# Patient Record
Sex: Female | Born: 1965 | Race: White | Hispanic: No | Marital: Single | Smoking: Current every day smoker
Health system: Southern US, Community
[De-identification: ages and names within clinical notes are randomized; demographics above are authoritative.]

## PROBLEM LIST (undated history)

## (undated) DIAGNOSIS — E559 Vitamin D deficiency, unspecified: Secondary | ICD-10-CM

## (undated) DIAGNOSIS — J45909 Unspecified asthma, uncomplicated: Secondary | ICD-10-CM

## (undated) DIAGNOSIS — R195 Other fecal abnormalities: Secondary | ICD-10-CM

## (undated) DIAGNOSIS — E7800 Pure hypercholesterolemia, unspecified: Secondary | ICD-10-CM

## (undated) HISTORY — PX: TUBAL LIGATION: SHX77

## (undated) HISTORY — PX: WISDOM TOOTH EXTRACTION: SHX21

## (undated) HISTORY — DX: Other fecal abnormalities: R19.5

## (undated) HISTORY — PX: TONSILLECTOMY AND ADENOIDECTOMY: SHX28

## (undated) HISTORY — DX: Vitamin D deficiency, unspecified: E55.9

## (undated) HISTORY — PX: APPENDECTOMY: SHX54

## (undated) HISTORY — DX: Pure hypercholesterolemia, unspecified: E78.00

## (undated) HISTORY — DX: Unspecified asthma, uncomplicated: J45.909

## (undated) HISTORY — PX: CHOLECYSTECTOMY: SHX55

---

## 2012-05-07 DIAGNOSIS — R195 Other fecal abnormalities: Secondary | ICD-10-CM

## 2012-05-07 HISTORY — DX: Other fecal abnormalities: R19.5

## 2012-09-12 ENCOUNTER — Other Ambulatory Visit (HOSPITAL_COMMUNITY)
Admission: RE | Admit: 2012-09-12 | Discharge: 2012-09-12 | Disposition: A | Discharge status: Home / Self Care | Payer: Self-pay | Source: Ambulatory Visit | Attending: Family Medicine | Admitting: Family Medicine

## 2012-09-12 DIAGNOSIS — R87810 Cervical high risk human papillomavirus (HPV) DNA test positive: Secondary | ICD-10-CM | POA: Insufficient documentation

## 2012-09-12 DIAGNOSIS — Z1151 Encounter for screening for human papillomavirus (HPV): Secondary | ICD-10-CM | POA: Insufficient documentation

## 2012-10-17 ENCOUNTER — Ambulatory Visit (INDEPENDENT_AMBULATORY_CARE_PROVIDER_SITE_OTHER): Payer: BC Managed Care – PPO | Admitting: Gynecology

## 2012-10-17 ENCOUNTER — Encounter: Payer: Self-pay | Admitting: Gynecology

## 2012-10-17 VITALS — BP 128/80 | Ht 61.0 in | Wt 146.0 lb

## 2012-10-17 DIAGNOSIS — N870 Mild cervical dysplasia: Secondary | ICD-10-CM | POA: Insufficient documentation

## 2012-10-17 NOTE — Progress Notes (Signed)
Patient is a 47 year old gravida 3 para 2 Ab1 who was referred for practice as a courtesy of Dr. 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  Patient denies any prior history of abnormal Pap smears. Patient currently not using any form of contraception. Patient reports normal menstrual cycles. Patient states she is in a monogamous relationship. Review of patient's record appears that there is a compliance issue on her part since she had been diagnosed by Dr. Zachery Dauer has having hyperlipidemia.  Patient underwent the detail colposcopic examination. Acetic acid was applied to the external genitalia and there was no lesions noted on the labia majora and minora periclitoral hood, perineum or perirectal region. A speculum was introduced into the vagina and acetic acid was once again applied and and a systematic fashion the entire vaginal tube, fornix, cervix was inspected. Endocervical speculum was also utilized and the transformation zone was visualized entirely without any lesions. An ECC was obtained and submitted for histological evaluation.   ASCCP Guidelines were discussed. If her ECC comes back benign it is recommended that she have a Pap smear with coat testing in 12 months. If HPV is negative she can return to Pap smears every 3 years. If the HPV comes back positive she will need colposcopy once again. All this was discussed with the patient and all questions were answered. We will send a copy of this office note to her primary physician Dr. Zachery Dauer.

## 2012-10-17 NOTE — Patient Instructions (Addendum)
Colposcopy Colposcopy is a procedure that uses a special lighted microscope (colposcope). It examines your cervix and vagina, or the area around the outside of the vagina, for signs of disease or abnormalities in the cells. You may be sent to a specialist (gynecologist) to do the colposcopy. A biopsy (tissue sample) may be collected during a colposcopy, if the caregiver finds any unusual cells. The biopsy is sent to the lab for further testing, and the results are reported back to your caregiver. A WOMAN MAY NEED THIS PROCEDURE IF:  She has had an abnormal pap smear (taking cells from the cervix for testing).  She has a sore on her cervix, and a Pap test was normal.  The Pap test suggests human papilloma virus (HPV). This virus can cause genital warts and is linked to the development of cervical cancer.  She has genital warts on the cervix, or in or around the outside of the vagina.  Her mother took the drug DES while pregnant.  She has painful intercourse.  She has vaginal bleeding, especially after sexual intercourse.  There is a need to evaluate the results of previous treatment. BEFORE THE PROCEDURE   Colposcopy is done when you are not having a menstrual period.  For 24 hours before the colposcopy, do not:  Douche.  Use tampons.  Use medicines, creams, or suppositories in the vagina.  Have sexual intercourse. PROCEDURE   A colposcopy is done while a woman is lying on her back with her feet in foot rests (stirrups).  A speculum is placed inside the vagina to keep it open and to allow the caregiver to see the cervix. This is the same instrument used to do a pap smear.  The colposcope is placed outside the vagina. It is used to magnify and examine the cervix, vagina, and the area around the outside of the vagina.  A small amount of liquid solution is placed on the area that is to be viewed. This solution is placed on with a cotton applicator. This solution makes it easier to  see the abnormal cells.  Your caregiver will suck out mucus and cells from the canal of the cervix.  Small pieces of tissue for biopsy may be taken at the same time. You may feel mild pain or discomfort when this is done.  Your caregiver will record the location of the abnormal areas and send the tissue samples to a lab for analysis.  If your caregiver biopsies the vagina or outside of the vagina, a local anesthetic (novocaine) is usually given. AFTER THE PROCEDURE   You may have some cramping that often goes away in a few minutes. You may have some soreness for a couple of days.  You may take over-the-counter pain medicine as advised by your caregiver. Do not take aspirin because it can cause bleeding.  Lie down for a few minutes if you feel lightheaded.  You may have some bleeding or dark discharge that should stop in a few days.  You may need to wear a sanitary pad for a few days. HOME CARE INSTRUCTIONS   Avoid sex, douching, and using tampons for a week or as directed.  Only take medicine as directed by your caregiver.  Continue to take birth control pills, if you are on them.  Not all test results are available during your visit. If your test results are not back during the visit, make an appointment with your caregiver to find out the results. Do not assume everything is   normal if you have not heard from your caregiver or the medical facility. It is important for you to follow up on all of your test results.  Follow your caregiver's advice regarding medicines, activity, follow-up visits, and follow-up Pap tests. SEEK MEDICAL CARE IF:   You develop a rash.  You have problems with your medicine. SEEK IMMEDIATE MEDICAL CARE IF:  You are bleeding heavily or are passing blood clots.  You develop a fever over 102 F (38.9 C), with or without chills.  You have abnormal vaginal discharge.  You are having cramps that do not go away after taking your pain medicine.  You  feel lightheaded, dizzy, or faint.  You develop stomach pain. Document Released: 07/14/2002 Document Revised: 07/16/2011 Document Reviewed: 02/24/2009 [employer] Patient Information 2014 [employer].  

## 2013-03-12 ENCOUNTER — Other Ambulatory Visit: Payer: Self-pay

## 2014-03-08 ENCOUNTER — Encounter: Payer: Self-pay | Admitting: Gynecology

## 2015-01-30 ENCOUNTER — Emergency Department (HOSPITAL_BASED_OUTPATIENT_CLINIC_OR_DEPARTMENT_OTHER): Payer: BLUE CROSS/BLUE SHIELD

## 2015-01-30 ENCOUNTER — Emergency Department (HOSPITAL_BASED_OUTPATIENT_CLINIC_OR_DEPARTMENT_OTHER)
Admission: EM | Admit: 2015-01-30 | Discharge: 2015-01-30 | Disposition: A | Discharge status: Home / Self Care | Payer: BLUE CROSS/BLUE SHIELD | Attending: Emergency Medicine | Admitting: Emergency Medicine

## 2015-01-30 ENCOUNTER — Encounter (HOSPITAL_BASED_OUTPATIENT_CLINIC_OR_DEPARTMENT_OTHER): Payer: Self-pay

## 2015-01-30 DIAGNOSIS — S299XXA Unspecified injury of thorax, initial encounter: Secondary | ICD-10-CM | POA: Insufficient documentation

## 2015-01-30 DIAGNOSIS — S0083XA Contusion of other part of head, initial encounter: Secondary | ICD-10-CM | POA: Insufficient documentation

## 2015-01-30 DIAGNOSIS — Z79899 Other long term (current) drug therapy: Secondary | ICD-10-CM | POA: Diagnosis not present

## 2015-01-30 DIAGNOSIS — S0012XA Contusion of left eyelid and periocular area, initial encounter: Secondary | ICD-10-CM | POA: Insufficient documentation

## 2015-01-30 DIAGNOSIS — Z7982 Long term (current) use of aspirin: Secondary | ICD-10-CM | POA: Insufficient documentation

## 2015-01-30 DIAGNOSIS — Y92410 Unspecified street and highway as the place of occurrence of the external cause: Secondary | ICD-10-CM | POA: Diagnosis not present

## 2015-01-30 DIAGNOSIS — Y9389 Activity, other specified: Secondary | ICD-10-CM | POA: Diagnosis not present

## 2015-01-30 DIAGNOSIS — Y998 Other external cause status: Secondary | ICD-10-CM | POA: Diagnosis not present

## 2015-01-30 DIAGNOSIS — Z8639 Personal history of other endocrine, nutritional and metabolic disease: Secondary | ICD-10-CM | POA: Insufficient documentation

## 2015-01-30 DIAGNOSIS — Z23 Encounter for immunization: Secondary | ICD-10-CM | POA: Insufficient documentation

## 2015-01-30 DIAGNOSIS — S0011XA Contusion of right eyelid and periocular area, initial encounter: Secondary | ICD-10-CM | POA: Insufficient documentation

## 2015-01-30 DIAGNOSIS — Z880 Allergy status to penicillin: Secondary | ICD-10-CM | POA: Diagnosis not present

## 2015-01-30 MED ORDER — ACETAMINOPHEN 500 MG PO TABS
1000.0000 mg | ORAL_TABLET | Freq: Once | ORAL | Status: AC
  Administered 2015-01-30: 1000 mg via ORAL
  Filled 2015-01-30: qty 2

## 2015-01-30 MED ORDER — TETANUS-DIPHTH-ACELL PERTUSSIS 5-2.5-18.5 LF-MCG/0.5 IM SUSP
0.5000 mL | Freq: Once | INTRAMUSCULAR | Status: AC
  Administered 2015-01-30: 0.5 mL via INTRAMUSCULAR
  Filled 2015-01-30: qty 0.5

## 2015-01-30 MED ORDER — OXYCODONE HCL 5 MG PO TABS
5.0000 mg | ORAL_TABLET | Freq: Once | ORAL | Status: AC
  Administered 2015-01-30: 5 mg via ORAL
  Filled 2015-01-30: qty 1

## 2015-01-30 MED ORDER — IBUPROFEN 800 MG PO TABS
800.0000 mg | ORAL_TABLET | Freq: Once | ORAL | Status: AC
  Administered 2015-01-30: 800 mg via ORAL
  Filled 2015-01-30: qty 1
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
## 2015-01-30 NOTE — ED Notes (Signed)
Patient transported to X-ray via stretcher, sr x 2 up 

## 2015-01-30 NOTE — ED Notes (Signed)
C/o pain at rt flank, rt lower back area, pain to rt hip
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
## 2015-01-30 NOTE — ED Notes (Signed)
MD at bedside. 
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
## 2015-02-03 ENCOUNTER — Encounter: Payer: Self-pay | Admitting: Family Medicine

## 2015-02-03 ENCOUNTER — Ambulatory Visit (INDEPENDENT_AMBULATORY_CARE_PROVIDER_SITE_OTHER): Payer: BLUE CROSS/BLUE SHIELD | Admitting: Family Medicine

## 2015-02-03 DIAGNOSIS — Z7189 Other specified counseling: Secondary | ICD-10-CM

## 2015-02-03 DIAGNOSIS — S161XXA Strain of muscle, fascia and tendon at neck level, initial encounter: Secondary | ICD-10-CM

## 2015-02-03 DIAGNOSIS — Z0000 Encounter for general adult medical examination without abnormal findings: Secondary | ICD-10-CM | POA: Diagnosis not present

## 2015-02-03 DIAGNOSIS — M545 Low back pain, unspecified: Secondary | ICD-10-CM

## 2015-02-03 DIAGNOSIS — Z7689 Persons encountering health services in other specified circumstances: Secondary | ICD-10-CM

## 2015-02-03 DIAGNOSIS — Z0279 Encounter for issue of other medical certificate: Secondary | ICD-10-CM

## 2015-02-03 DIAGNOSIS — R0789 Other chest pain: Secondary | ICD-10-CM | POA: Diagnosis not present

## 2015-02-03 MED ORDER — CYCLOBENZAPRINE HCL 5 MG PO TABS: 5.0000 mg | ORAL_TABLET | Freq: Two times a day (BID) | ORAL | Status: DC | PRN

## 2015-02-03 MED ORDER — IBUPROFEN 600 MG PO TABS: 600.0000 mg | ORAL_TABLET | Freq: Three times a day (TID) | ORAL | Status: DC | PRN

## 2015-02-03 NOTE — Progress Notes (Signed)
Pre visit review using our clinic review tool, if applicable. No additional management support is needed unless otherwise documented below in the visit note. 

## 2015-02-03 NOTE — Patient Instructions (Signed)
It was great to meet you today. Make an appointment within 2 months to get your complete physical and get established completely with [place], by then I will have your records.  Rest, heat, ibuprofen prescribed, flexeril (muscle relaxer) prescribed - take the relaxer at night only.   Cervical Sprain A cervical sprain is an injury in the neck in which the strong, fibrous tissues (ligaments) that connect your neck bones stretch or tear. Cervical sprains can range from mild to severe. Severe cervical sprains can cause the neck vertebrae to be unstable. This can lead to damage of the spinal cord and can result in serious nervous system problems. The amount of time it takes for a cervical sprain to get better depends on the cause and extent of the injury. Most cervical sprains heal in 1 to 3 weeks. CAUSES  Severe cervical sprains may be caused by:   Contact sport injuries (such as from football, rugby, wrestling, hockey, auto racing, gymnastics, diving, martial arts, or boxing).   Motor vehicle collisions.   Whiplash injuries. This is an injury from a sudden forward and backward whipping movement of the head and neck.  Falls.  Mild cervical sprains may be caused by:   Being in an awkward position, such as while cradling a telephone between your ear and shoulder.   Sitting in a chair that does not offer proper support.   Working at a poorly Marketing executive station.   Looking up or down for long periods of time.  SYMPTOMS   Pain, soreness, stiffness, or a burning sensation in the front, back, or sides of the neck. This discomfort may develop immediately after the injury or slowly, 24 hours or more after the injury.   Pain or tenderness directly in the middle of the back of the neck.   Shoulder or upper back pain.   Limited ability to move the neck.   Headache.   Dizziness.   Weakness, numbness, or tingling in the hands or arms.   Muscle spasms.   Difficulty swallowing  or chewing.   Tenderness and swelling of the neck.  DIAGNOSIS  Most of the time your health care Jayshon Dommer can diagnose a cervical sprain by taking your history and doing a physical exam. Your health care Harshitha Fretz will ask about previous neck injuries and any known neck problems, such as arthritis in the neck. X-rays may be taken to find out if there are any other problems, such as with the bones of the neck. Other tests, such as a CT scan or MRI, may also be needed.  TREATMENT  Treatment depends on the severity of the cervical sprain. Mild sprains can be treated with rest, keeping the neck in place (immobilization), and pain medicines. Severe cervical sprains are immediately immobilized. Further treatment is done to help with pain, muscle spasms, and other symptoms and may include:  Medicines, such as pain relievers, numbing medicines, or muscle relaxants.   Physical therapy. This may involve stretching exercises, strengthening exercises, and posture training. Exercises and improved posture can help stabilize the neck, strengthen muscles, and help stop symptoms from returning.  HOME CARE INSTRUCTIONS   Put ice on the injured area.   Put ice in a plastic bag.   Place a towel between your skin and the bag.   Leave the ice on for 15-20 minutes, 3-4 times a day.   If your injury was severe, you may have been given a cervical collar to wear. A cervical collar is a two-piece collar  designed to keep your neck from moving while it heals.  Do not remove the collar unless instructed by your health care Raylin Winer.  If you have long hair, keep it outside of the collar.  Ask your health care Graesyn Schreifels before making any adjustments to your collar. Minor adjustments may be required over time to improve comfort and reduce pressure on your chin or on the back of your head.  Ifyou are allowed to remove the collar for cleaning or bathing, follow your health care Makayah Pauli's instructions on how to do  so safely.  Keep your collar clean by wiping it with mild soap and water and drying it completely. If the collar you have been given includes removable pads, remove them every 1-2 days and hand wash them with soap and water. Allow them to air dry. They should be completely dry before you wear them in the collar.  If you are allowed to remove the collar for cleaning and bathing, wash and dry the skin of your neck. Check your skin for irritation or sores. If you see any, tell your health care Jesilyn Easom.  Do not drive while wearing the collar.   Only take over-the-counter or prescription medicines for pain, discomfort, or fever as directed by your health care Leslie Jester.   Keep all follow-up appointments as directed by your health care Idona Stach.   Keep all physical therapy appointments as directed by your health care Cung Masterson.   Make any needed adjustments to your workstation to promote good posture.   Avoid positions and activities that make your symptoms worse.   Warm up and stretch before being active to help prevent problems.  SEEK MEDICAL CARE IF:   Your pain is not controlled with medicine.   You are unable to decrease your pain medicine over time as planned.   Your activity level is not improving as expected.  SEEK IMMEDIATE MEDICAL CARE IF:   You develop any bleeding.  You develop stomach upset.  You have signs of an allergic reaction to your medicine.   Your symptoms get worse.   You develop new, unexplained symptoms.   You have numbness, tingling, weakness, or paralysis in any part of your body.  MAKE SURE YOU:   Understand these instructions.  Will watch your condition.  Will get help right away if you are not doing well or get worse. Document Released: 02/18/2007 Document Revised: 04/28/2013 Document Reviewed: 10/29/2012 [facility] Patient Information 2015 [place], [place]. This information is not intended to replace advice given to you by your health  care Raef Sprigg. Make sure you discuss any questions you have with your health care Tocara Mennen.
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
## 2015-02-09 ENCOUNTER — Encounter: Payer: Self-pay | Admitting: Family Medicine

## 2015-02-09 DIAGNOSIS — E785 Hyperlipidemia, unspecified: Secondary | ICD-10-CM | POA: Insufficient documentation

## 2015-03-22 ENCOUNTER — Encounter: Payer: Self-pay | Admitting: Family Medicine

## 2015-03-22 ENCOUNTER — Other Ambulatory Visit (HOSPITAL_COMMUNITY)
Admission: RE | Admit: 2015-03-22 | Discharge: 2015-03-22 | Disposition: A | Discharge status: Home / Self Care | Payer: BLUE CROSS/BLUE SHIELD | Source: Ambulatory Visit | Attending: Family Medicine | Admitting: Family Medicine

## 2015-03-22 ENCOUNTER — Ambulatory Visit (INDEPENDENT_AMBULATORY_CARE_PROVIDER_SITE_OTHER): Payer: BLUE CROSS/BLUE SHIELD | Admitting: Family Medicine

## 2015-03-22 VITALS — BP 118/83 | HR 89 | Temp 97.9°F | Resp 20 | Ht 60.0 in | Wt 141.0 lb

## 2015-03-22 DIAGNOSIS — IMO0001 Reserved for inherently not codable concepts without codable children: Secondary | ICD-10-CM | POA: Insufficient documentation

## 2015-03-22 DIAGNOSIS — Z1239 Encounter for other screening for malignant neoplasm of breast: Secondary | ICD-10-CM | POA: Insufficient documentation

## 2015-03-22 DIAGNOSIS — R5382 Chronic fatigue, unspecified: Secondary | ICD-10-CM | POA: Diagnosis not present

## 2015-03-22 DIAGNOSIS — Z0000 Encounter for general adult medical examination without abnormal findings: Secondary | ICD-10-CM | POA: Diagnosis not present

## 2015-03-22 DIAGNOSIS — E785 Hyperlipidemia, unspecified: Secondary | ICD-10-CM

## 2015-03-22 DIAGNOSIS — Z1151 Encounter for screening for human papillomavirus (HPV): Secondary | ICD-10-CM | POA: Diagnosis not present

## 2015-03-22 DIAGNOSIS — Z01419 Encounter for gynecological examination (general) (routine) without abnormal findings: Secondary | ICD-10-CM | POA: Diagnosis present

## 2015-03-22 DIAGNOSIS — R5383 Other fatigue: Secondary | ICD-10-CM | POA: Insufficient documentation

## 2015-03-22 DIAGNOSIS — R030 Elevated blood-pressure reading, without diagnosis of hypertension: Secondary | ICD-10-CM | POA: Diagnosis not present

## 2015-03-22 LAB — CBC WITH DIFFERENTIAL/PLATELET
BASOS ABS: 0.2 10*3/uL — AB (ref 0.0–0.1)
Basophils Relative: 1.6 % (ref 0.0–3.0)
EOS ABS: 0.2 10*3/uL (ref 0.0–0.7)
Eosinophils Relative: 1.8 % (ref 0.0–5.0)
HCT: 44.5 % (ref 36.0–46.0)
Hemoglobin: 14.7 g/dL (ref 12.0–15.0)
Lymphocytes Relative: 17.6 % (ref 12.0–46.0)
Lymphs Abs: 2 10*3/uL (ref 0.7–4.0)
MCHC: 33 g/dL (ref 30.0–36.0)
MCV: 99.4 fl (ref 78.0–100.0)
MONO ABS: 0.8 10*3/uL (ref 0.1–1.0)
MONOS PCT: 7.3 % (ref 3.0–12.0)
NEUTROS ABS: 8 10*3/uL — AB (ref 1.4–7.7)
Neutrophils Relative %: 71.7 % (ref 43.0–77.0)
Platelets: 397 10*3/uL (ref 150.0–400.0)
RBC: 4.48 Mil/uL (ref 3.87–5.11)
RDW: 12.7 % (ref 11.5–15.5)
WBC: 11.1 10*3/uL — ABNORMAL HIGH (ref 4.0–10.5)

## 2015-03-22 LAB — VITAMIN B12: Vitamin B-12: 514 pg/mL (ref 211–911)

## 2015-03-22 LAB — COMPREHENSIVE METABOLIC PANEL
ALBUMIN: 4.1 g/dL (ref 3.5–5.2)
ALK PHOS: 57 U/L (ref 39–117)
ALT: 13 U/L (ref 0–35)
AST: 17 U/L (ref 0–37)
BILIRUBIN TOTAL: 0.4 mg/dL (ref 0.2–1.2)
BUN: 25 mg/dL — AB (ref 6–23)
CALCIUM: 10.3 mg/dL (ref 8.4–10.5)
CO2: 33 mEq/L — ABNORMAL HIGH (ref 19–32)
CREATININE: 0.93 mg/dL (ref 0.40–1.20)
Chloride: 102 mEq/L (ref 96–112)
GFR: 67.36 mL/min (ref >60.00)
Glucose, Bld: 91 mg/dL (ref 70–99)
Potassium: 4.4 mEq/L (ref 3.5–5.1)
SODIUM: 140 meq/L (ref 135–145)
TOTAL PROTEIN: 6.9 g/dL (ref 6.0–8.3)

## 2015-03-22 LAB — LIPID PANEL
CHOLESTEROL: 261 mg/dL — AB (ref 0–200)
HDL: 49.2 mg/dL (ref >39.00)
LDL Cholesterol: 192 mg/dL — ABNORMAL HIGH (ref 0–99)
NonHDL: 212.14
TRIGLYCERIDES: 102 mg/dL (ref 0.0–149.0)
Total CHOL/HDL Ratio: 5
VLDL: 20.4 mg/dL (ref 0.0–40.0)

## 2015-03-22 LAB — VITAMIN D 25 HYDROXY (VIT D DEFICIENCY, FRACTURES): VITD: 56.59 ng/mL (ref 30.00–100.00)

## 2015-03-22 LAB — TSH: TSH: 2.41 u[IU]/mL (ref 0.35–4.50)

## 2015-03-22 LAB — HEMOGLOBIN A1C: HEMOGLOBIN A1C: 5.8 % (ref 4.6–6.5)

## 2015-03-22 NOTE — Progress Notes (Signed)
Subjective:    Patient ID: Samantha Bradley, female    DOB: February 29, 1966, 49 y.o.   MRN: [id number]  HPI  CC: Preventive  Annual visit.   Colonoscopy: No family history, never had colonoscopy. Mammogram: No family history, prior mammogram in her 66s, no mammogram since. Cervical cancer screening: overdue. Patient states it was many years ago, she believes she had an abnormal Pap at that time as well. She did not follow-up with gynecology. Immunizations: Up-to-date tetanus, flu vaccination repeatedly declined Infectious disease screening:  HIV and hepatitis C indicated    Past Medical History  Diagnosis Date  . Hypercholesterolemia   . Asthma   . Vitamin D deficiency   . Heme positive stool 2014    declined GI referral   Allergies  Allergen Reactions  . Penicillins Rash   Past Surgical History  Procedure Laterality Date  . Cesarean section    . Cholecystectomy    . Appendectomy    . Tonsillectomy and adenoidectomy    . Wisdom tooth extraction    . Tubal ligation     Family History  Problem Relation Age of Onset  . COPD Mother   . Asthma Mother   . Diabetes Father   . Cancer Father     MULTIPLE MYELOMA, LEUKEMIA  . Heart disease Father   . Diabetes Mother    Social History   Social History  . Marital Status: Single    Spouse Name: N/A  . Number of Children: N/A  . Years of Education: N/A   Occupational History  . Not on file.   Social History Main Topics  . Smoking status: Never Smoker   . Smokeless tobacco: Never Used  . Alcohol Use: Yes     Comment: OCC  . Drug Use: No  . Sexual Activity: Yes   Other Topics Concern  . Not on file   Social History Narrative   Single. G3P3.  In a relationship, female partner. She feels dafe in her relationship.   Moved to [place] about 12 years ago. 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   Wears seatbelt, has fire alarm in the home, no guns in the home.       Past medical history, surgical  history, family history, immunizations, screenings, medications and social history was updated today. Review of Systems Negative, with the exception of above mentioned in HPI      Objective:   Physical Exam BP 118/83 mmHg  Pulse 89  Temp(Src) 97.9 F (36.6 C) (Oral)  Resp 20  Ht 5' (1.524 m)  Wt 141 lb (63.957 kg)  BMI 27.54 kg/m2  SpO2 100%  LMP 09/13/2012 Gen: Afebrile. No acute distress. Nontoxic in appearance, well-developed, well-nourished Caucasian female. Pleasant. HENT: AT. [place]. Bilateral TM visualized and normal in appearance. MMM. No oral or buccal lesions. Adequate dentition. Bilateral nares without erythema or swelling. Throat without erythema or exudates. No cough on exam, and no hoarseness on exam. Eyes:Pupils Equal Round Reactive to light, Extraocular movements intact,  Conjunctiva without redness, discharge or icterus. Neck/lymp/endocrine: Supple, no lymphadenopathy, no thyromegaly CV: RRR no murmur appreciated, no edema, +2/4 P posterior tibialis pulses Chest: CTAB, no wheeze or crackles Abd: Soft. Round. ND. Mild tenderness left lower quadrant. BS present. No Masses palpated. No hepatosplenomegaly MSK: No erythema, swelling of joints. No obvious deformities. Full range of motion. Skin: No rashes, purpura or petechiae.  Neuro:  Normal gait. PERLA. EOMi. Alert. Oriented x3 Muscle strength 5/5 upper and  lower extremity.  Psych: Normal affect, dress and demeanor. Normal speech. Normal thought content and judgment.  Breasts: breasts appear normal, no suspicious masses, no skin or nipple changes or axillary nodes. GYN:  External genitalia within normal limits.  Vaginal mucosa pink, moist, normal rugae.  Nonfriable cervix without lesions, stenotic cervical os, no discharge or bleeding noted on speculum exam.  Bimanual exam revealed normal, nongravid uterus.  No cervical motion tenderness. No adnexal masses bilaterally.        Assessment & Plan:  Hyperlipidemia/elevated  blood pressure - Patient counseled on fresh fruits and vegetables, high-fiber, lean meat diet. Patient has a history of hyperlipidemia, and currently is on fish oil supplementation. Family history of heart disease in her father. Patient has had elevated blood pressure readings in the past without history of hypertension. Blood pressure today is stable. - Lipid panel - Comp Met (CMET) - TSH - HgB A1c - CBC w/Diff  Chronic fatigue - Patient has history of increased fatigue, will check vitamin B-12, TSH and vitamin D today.  Health care maintenance/breast cancer screening/preventative health - AVS on age/gender health maintenance recommendations provided to patient today. Patient encouraged to take daily multivitamin, vitamin D and calcium supplementation. - Patient was encouraged to increase her exercise to greater than 150 minutes a week. Make dietary modifications eating more fresh fruits and vegetables and lean meats. Colonoscopy: No family history, never had colonoscopy. Patient adamantly declines any colon cancer screening. Patient was counseled on importance of screening for prevention and/or treatment. Patient's declined Hemoccult cards as well stating if she had colon cancer she would not want to do anything about it. Patient has been heme positive by prior PCP records. Mammogram: No family history, prior mammogram in her 75s, no mammogram since. Mammogram ordered today. Cervical cancer screening: Last Pap by prior records requested from PCP P Pap completed in 2014 LSIL, ECC negative. She did not follow-up with gynecology. Pap smear completed today with HPV. Cervix os was very stenotic, patient with mild left abdominal tenderness on palpation. Await Pap results, likely will be without transition zone, depending upon results consider abdominal/transvaginal ultrasound. Immunizations: Up-to-date tetanus, flu vaccination repeatedly declined Infectious disease screening:  HIV and hepatitis C  indicated, declined today. - Cytology - PAP with HPV - MM DIGITAL SCREENING BILATERAL; Future  Follow-up in one year, unless needed sooner, or abnormalities of labs suggest closer follow-up.

## 2015-03-22 NOTE — Patient Instructions (Signed)
Health Maintenance, Female Adopting a healthy lifestyle and getting preventive care can go a long way to promote health and wellness. Talk with your health care provider about what schedule of regular examinations is right for you. This is a good chance for you to check in with your provider about disease prevention and staying healthy. In between checkups, there are plenty of things you can do on your own. Experts have done a lot of research about which lifestyle changes and preventive measures are most likely to keep you healthy. Ask your health care provider for more information. WEIGHT AND DIET  Eat a healthy diet  Be sure to include plenty of vegetables, fruits, low-fat dairy products, and lean protein.  Do not eat a lot of foods high in solid fats, added sugars, or salt.  Get regular exercise. This is one of the most important things you can do for your health.  Most adults should exercise for at least 150 minutes each week. The exercise should increase your heart rate and make you sweat (moderate-intensity exercise).  Most adults should also do strengthening exercises at least twice a week. This is in addition to the moderate-intensity exercise.  Maintain a healthy weight  Body mass index (BMI) is a measurement that can be used to identify possible weight problems. It estimates body fat based on height and weight. Your health care provider can help determine your BMI and help you achieve or maintain a healthy weight.  For females 20 years of age and older:   A BMI below 18.5 is considered underweight.  A BMI of 18.5 to 24.9 is normal.  A BMI of 25 to 29.9 is considered overweight.  A BMI of 30 and above is considered obese.  Watch levels of cholesterol and blood lipids  You should start having your blood tested for lipids and cholesterol at 51 years of age, then have this test every 5 years.  You may need to have your cholesterol levels checked more often if:  Your lipid  or cholesterol levels are high.  You are older than 50 years of age.  You are at high risk for heart disease.  CANCER SCREENING   Lung Cancer  Lung cancer screening is recommended for adults 55-80 years old who are at high risk for lung cancer because of a history of smoking.  A yearly low-dose CT scan of the lungs is recommended for people who:  Currently smoke.  Have quit within the past 15 years.  Have at least a 30-pack-year history of smoking. A pack year is smoking an average of one pack of cigarettes a day for 1 year.  Yearly screening should continue until it has been 15 years since you quit.  Yearly screening should stop if you develop a health problem that would prevent you from having lung cancer treatment.  Breast Cancer  Practice breast self-awareness. This means understanding how your breasts normally appear and feel.  It also means doing regular breast self-exams. Let your health care provider know about any changes, no matter how small.  If you are in your 20s or 30s, you should have a clinical breast exam (CBE) by a health care provider every 1-3 years as part of a regular health exam.  If you are 40 or older, have a CBE every year. Also consider having a breast X-ray (mammogram) every year.  If you have a family history of breast cancer, talk to your health care provider about genetic screening.  If you   are at high risk for breast cancer, talk to your health care provider about having an MRI and a mammogram every year.  Breast cancer gene (BRCA) assessment is recommended for women who have family members with BRCA-related cancers. BRCA-related cancers include:  Breast.  Ovarian.  Tubal.  Peritoneal cancers.  Results of the assessment will determine the need for genetic counseling and BRCA1 and BRCA2 testing. Cervical Cancer Your health care provider may recommend that you be screened regularly for cancer of the pelvic organs (ovaries, uterus, and  vagina). This screening involves a pelvic examination, including checking for microscopic changes to the surface of your cervix (Pap test). You may be encouraged to have this screening done every 3 years, beginning at age 21.  For women ages 30-65, health care providers may recommend pelvic exams and Pap testing every 3 years, or they may recommend the Pap and pelvic exam, combined with testing for human papilloma virus (HPV), every 5 years. Some types of HPV increase your risk of cervical cancer. Testing for HPV may also be done on women of any age with unclear Pap test results.  Other health care providers may not recommend any screening for nonpregnant women who are considered low risk for pelvic cancer and who do not have symptoms. Ask your health care provider if a screening pelvic exam is right for you.  If you have had past treatment for cervical cancer or a condition that could lead to cancer, you need Pap tests and screening for cancer for at least 20 years after your treatment. If Pap tests have been discontinued, your risk factors (such as having a new sexual partner) need to be reassessed to determine if screening should resume. Some women have medical problems that increase the chance of getting cervical cancer. In these cases, your health care provider may recommend more frequent screening and Pap tests. Colorectal Cancer  This type of cancer can be detected and often prevented.  Routine colorectal cancer screening usually begins at 50 years of age and continues through 51 years of age.  Your health care provider may recommend screening at an earlier age if you have risk factors for colon cancer.  Your health care provider may also recommend using home test kits to check for hidden blood in the stool.  A small camera at the end of a tube can be used to examine your colon directly (sigmoidoscopy or colonoscopy). This is done to check for the earliest forms of colorectal  cancer.  Routine screening usually begins at age 50.  Direct examination of the colon should be repeated every 5-10 years through 51 years of age. However, you may need to be screened more often if early forms of precancerous polyps or small growths are found. Skin Cancer  Check your skin from head to toe regularly.  Tell your health care provider about any new moles or changes in moles, especially if there is a change in a mole's shape or color.  Also tell your health care provider if you have a mole that is larger than the size of a pencil eraser.  Always use sunscreen. Apply sunscreen liberally and repeatedly throughout the day.  Protect yourself by wearing long sleeves, pants, a wide-brimmed hat, and sunglasses whenever you are outside. HEART DISEASE, DIABETES, AND HIGH BLOOD PRESSURE   High blood pressure causes heart disease and increases the risk of stroke. High blood pressure is more likely to develop in:  People who have blood pressure in the high end   of the normal range (130-139/85-89 mm Hg).  People who are overweight or obese.  People who are African American.  If you are 38-23 years of age, have your blood pressure checked every 3-5 years. If you are 61 years of age or older, have your blood pressure checked every year. You should have your blood pressure measured twice--once when you are at a hospital or clinic, and once when you are not at a hospital or clinic. Record the average of the two measurements. To check your blood pressure when you are not at a hospital or clinic, you can use:  An automated blood pressure machine at a pharmacy.  A home blood pressure monitor.  If you are between 45 years and 39 years old, ask your health care provider if you should take aspirin to prevent strokes.  Have regular diabetes screenings. This involves taking a blood sample to check your fasting blood sugar level.  If you are at a normal weight and have a low risk for diabetes,  have this test once every three years after 51 years of age.  If you are overweight and have a high risk for diabetes, consider being tested at a younger age or more often. PREVENTING INFECTION  Hepatitis B  If you have a higher risk for hepatitis B, you should be screened for this virus. You are considered at high risk for hepatitis B if:  You were born in a country where hepatitis B is common. Ask your health care provider which countries are considered high risk.  Your parents were born in a high-risk country, and you have not been immunized against hepatitis B (hepatitis B vaccine).  You have HIV or AIDS.  You use needles to inject street drugs.  You live with someone who has hepatitis B.  You have had sex with someone who has hepatitis B.  You get hemodialysis treatment.  You take certain medicines for conditions, including cancer, organ transplantation, and autoimmune conditions. Hepatitis C  Blood testing is recommended for:  Everyone born from [birth year] through 1967.  Anyone with known risk factors for hepatitis C. Sexually transmitted infections (STIs)  You should be screened for sexually transmitted infections (STIs) including gonorrhea and chlamydia if:  You are sexually active and are younger than 51 years of age.  You are older than 51 years of age and your health care provider tells you that you are at risk for this type of infection.  Your sexual activity has changed since you were last screened and you are at an increased risk for chlamydia or gonorrhea. Ask your health care provider if you are at risk.  If you do not have HIV, but are at risk, it may be recommended that you take a prescription medicine daily to prevent HIV infection. This is called pre-exposure prophylaxis (PrEP). You are considered at risk if:  You are sexually active and do not regularly use condoms or know the HIV status of your partner(s).  You take drugs by injection.  You are sexually  active with a partner who has HIV. Talk with your health care provider about whether you are at high risk of being infected with HIV. If you choose to begin PrEP, you should first be tested for HIV. You should then be tested every 3 months for as long as you are taking PrEP.  PREGNANCY   If you are premenopausal and you may become pregnant, ask your health care provider about preconception counseling.  If you may  become pregnant, take 400 to 800 micrograms (mcg) of folic acid every day.  If you want to prevent pregnancy, talk to your health care provider about birth control (contraception). OSTEOPOROSIS AND MENOPAUSE   Osteoporosis is a disease in which the bones lose minerals and strength with aging. This can result in serious bone fractures. Your risk for osteoporosis can be identified using a bone density scan.  If you are 20 years of age or older, or if you are at risk for osteoporosis and fractures, ask your health care provider if you should be screened.  Ask your health care provider whether you should take a calcium or vitamin D supplement to lower your risk for osteoporosis.  Menopause may have certain physical symptoms and risks.  Hormone replacement therapy may reduce some of these symptoms and risks. Talk to your health care provider about whether hormone replacement therapy is right for you.  HOME CARE INSTRUCTIONS   Schedule regular health, dental, and eye exams.  Stay current with your immunizations.   Do not use any tobacco products including cigarettes, chewing tobacco, or electronic cigarettes.  If you are pregnant, do not drink alcohol.  If you are breastfeeding, limit how much and how often you drink alcohol.  Limit alcohol intake to no more than 1 drink per day for nonpregnant women. One drink equals 12 ounces of beer, 5 ounces of wine, or 1 ounces of hard liquor.  Do not use street drugs.  Do not share needles.  Ask your health care provider for help if  you need support or information about quitting drugs.  Tell your health care provider if you often feel depressed.  Tell your health care provider if you have ever been abused or do not feel safe at home.   This information is not intended to replace advice given to you by your health care provider. Make sure you discuss any questions you have with your health care provider.   Document Released: 11/06/2010 Document Revised: 05/14/2014 Document Reviewed: 03/25/2013 Elsevier Interactive Patient Education 2016 [place].   Attempt to exercise > 150 minutes a week. Eat a diet high in fiber, with plenty of fresh fruit and veggies, lean meats. Take a multivitamin and 600 iu of vitamin D a day, 1000-1200 of calcium daily.   We will call you with the results of her PAP and labs once they are available.

## 2015-03-23 ENCOUNTER — Telehealth: Payer: Self-pay | Admitting: Family Medicine

## 2015-03-23 DIAGNOSIS — Z79899 Other long term (current) drug therapy: Secondary | ICD-10-CM

## 2015-03-23 DIAGNOSIS — R899 Unspecified abnormal finding in specimens from other organs, systems and tissues: Secondary | ICD-10-CM

## 2015-03-23 DIAGNOSIS — R1032 Left lower quadrant pain: Secondary | ICD-10-CM

## 2015-03-23 DIAGNOSIS — R87619 Unspecified abnormal cytological findings in specimens from cervix uteri: Secondary | ICD-10-CM

## 2015-03-23 DIAGNOSIS — Z01411 Encounter for gynecological examination (general) (routine) with abnormal findings: Secondary | ICD-10-CM

## 2015-03-23 MED ORDER — ATORVASTATIN CALCIUM 20 MG PO TABS: 20.0000 mg | ORAL_TABLET | Freq: Every day | ORAL | Status: DC

## 2015-03-23 NOTE — Telephone Encounter (Signed)
Left message for patient to call back to review lab results and instructions 

## 2015-03-23 NOTE — Telephone Encounter (Addendum)
Please call pt concerning labs: - Her thyroid, liver, kidneys are functioning normal. Vit D and B12 are good.  - Since she had discomfort on pelvic exam and h/o of abnormal pap, I have ordered a pelvic US. - I will need a repeat of on e of the labs we collected, she had mildly elevated BUN and CO2, which could have been secondary to her not eating anything that day for labs, but will need to repeat to make sure.  - her cholesterol is high, total 261. Bad cholesterol (LDL) is 192. With an LDL that high she should make dietary changes (fresh fruit/veggies, lean meats) and increase her exercise to >150 minutes a week. She should also be placed on a cholesterol lowering medication. I have called in a medication for her to start to help lower her cholesterol. She will need rpt labs in 8 weeks after starting medication (which can be done by lab appt only).  - Her a1c (which looks for diabetes) is mildly elevated (5.8) and placed her in the "pre-diabetic" category. This will need to be monitored in 6  Months to make certain she she does not develop Diabetes. Making the above recommended changes to diet and exercise will help lower this number and her chances of developing diabetes.  - her PAP has not returned yet, if abnormal will call.  1. Schedule lab appt (not fasting) for repeat BMP at earliest convenience.  2. US schedule. 3. 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 Provider appt in 6 months, with repeat A1c prior to rooming.

## 2015-03-23 NOTE — Telephone Encounter (Signed)
Patient is aware of all lab results.  Pt scheduled lab appointment for 03/28/15 @ 8am for NON fasting appointment for BMP.  Pt then transferred to [facility] to get US scheduled.

## 2015-03-23 NOTE — Telephone Encounter (Signed)
Patient called and left message requesting call after 4:30 pm. Called patient at 4:37pm no answer left message for patient to return our call.

## 2015-03-24 LAB — CYTOLOGY - PAP

## 2015-03-25 ENCOUNTER — Telehealth: Payer: Self-pay | Admitting: Family Medicine

## 2015-03-25 NOTE — Telephone Encounter (Signed)
Left message for patient to call back  

## 2015-03-25 NOTE — Telephone Encounter (Signed)
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 This requires her to have repeat testing/PAP in 12 months.  - I definitely still wan the her to have the US completed, I see she has scheduled for that at the end of this month. I will call her with those results once available.

## 2015-03-28 ENCOUNTER — Other Ambulatory Visit: Payer: BLUE CROSS/BLUE SHIELD

## 2015-03-28 NOTE — Telephone Encounter (Signed)
Left message for patient to call back to review PAP results.

## 2015-04-05 ENCOUNTER — Ambulatory Visit
Admission: RE | Admit: 2015-04-05 | Discharge: 2015-04-05 | Disposition: A | Discharge status: Home / Self Care | Payer: BLUE CROSS/BLUE SHIELD | Source: Ambulatory Visit | Attending: Family Medicine | Admitting: Family Medicine

## 2015-04-05 ENCOUNTER — Telehealth: Payer: Self-pay | Admitting: Family Medicine

## 2015-04-05 DIAGNOSIS — Z01411 Encounter for gynecological examination (general) (routine) with abnormal findings: Secondary | ICD-10-CM

## 2015-04-05 DIAGNOSIS — R87619 Unspecified abnormal cytological findings in specimens from cervix uteri: Secondary | ICD-10-CM

## 2015-04-05 DIAGNOSIS — B977 Papillomavirus as the cause of diseases classified elsewhere: Secondary | ICD-10-CM

## 2015-04-05 NOTE — Telephone Encounter (Signed)
Patient is aware of all results as well as the seriousness of this matter but states she does not want to schedule anything at this time.   She states she will CB when she decides what she wants to do.  I repeated the seriousness of these results but pt didn't seem to care.  Pt wouldn't even decide if she wanted to se Dr. Lily PeerFernandez or new referral.

## 2015-04-05 NOTE — Telephone Encounter (Signed)
Please call pt: - Her US showed a very small fibroid (not cancerous) on the right side of her uterus, otherwise her uterus/ovaries appeared normal from US prospective. If she is still having abdominal discomfort we would need to evaluate her further.  - In review of past records, pt was seen by Dr. Lily PeerFernandez, gynecology for abnormal PAP with negative (benign) ECC, and PAP at that time was [facility]1 (2014) at [facility].  - She did  Not follow up thereafter. Her PAP collected here last month was without malignant changes, with adequate specimen, but was HPV high risk positive.  - With this history she should be followed more closely with gyn, and likey have repeat colonoscopy. We can either place a referral for her, or she can call Dr. Lily PeerFernandez and schedule an appt.  - Please send results to his office, if she chooses to go there. Otherwise we will send  with new referral. Additional abnormal  PAP from [facility] (09/12/2012) scanned in epic and ECC cytology from 10/17/2012 under pathology in epic results.  - please advise if new referral needed.

## 2015-04-05 NOTE — Telephone Encounter (Signed)
Sorry about the GI in the previous notes. Patient came by and signed a record release for [facility], we checked on her insurance & patient would like to go to [facility]. Thank you.

## 2015-04-05 NOTE — Telephone Encounter (Signed)
Referral placed for Gyn.

## 2015-04-05 NOTE — Telephone Encounter (Signed)
Can you please enter referral.

## 2015-04-05 NOTE — Telephone Encounter (Signed)
Patient called back. She has decided she wants to go to [facility]. Patient will come by out office & sign a record release form to [facility] so that we can send the notes to [facility] for a referral.

## 2015-04-15 ENCOUNTER — Ambulatory Visit
Admission: RE | Admit: 2015-04-15 | Discharge: 2015-04-15 | Disposition: A | Discharge status: Home / Self Care | Payer: BLUE CROSS/BLUE SHIELD | Source: Ambulatory Visit | Attending: Family Medicine | Admitting: Family Medicine

## 2015-04-15 DIAGNOSIS — R928 Other abnormal and inconclusive findings on diagnostic imaging of breast: Secondary | ICD-10-CM

## 2015-04-15 DIAGNOSIS — Z1239 Encounter for other screening for malignant neoplasm of breast: Secondary | ICD-10-CM

## 2015-04-19 DIAGNOSIS — R928 Other abnormal and inconclusive findings on diagnostic imaging of breast: Secondary | ICD-10-CM | POA: Insufficient documentation

## 2015-04-20 ENCOUNTER — Other Ambulatory Visit: Payer: Self-pay | Admitting: Family Medicine

## 2015-04-20 DIAGNOSIS — R928 Other abnormal and inconclusive findings on diagnostic imaging of breast: Secondary | ICD-10-CM

## 2015-04-25 ENCOUNTER — Encounter: Payer: Self-pay | Admitting: Family Medicine

## 2015-04-25 ENCOUNTER — Ambulatory Visit (INDEPENDENT_AMBULATORY_CARE_PROVIDER_SITE_OTHER): Payer: BLUE CROSS/BLUE SHIELD | Admitting: Family Medicine

## 2015-04-25 ENCOUNTER — Ambulatory Visit
Admission: RE | Admit: 2015-04-25 | Discharge: 2015-04-25 | Disposition: A | Discharge status: Home / Self Care | Payer: BLUE CROSS/BLUE SHIELD | Source: Ambulatory Visit | Attending: Family Medicine | Admitting: Family Medicine

## 2015-04-25 VITALS — BP 114/82 | HR 77 | Temp 97.6°F | Resp 18 | Ht 60.0 in | Wt 139.0 lb

## 2015-04-25 DIAGNOSIS — S161XXA Strain of muscle, fascia and tendon at neck level, initial encounter: Secondary | ICD-10-CM

## 2015-04-25 DIAGNOSIS — R928 Other abnormal and inconclusive findings on diagnostic imaging of breast: Secondary | ICD-10-CM

## 2015-04-25 DIAGNOSIS — H9191 Unspecified hearing loss, right ear: Secondary | ICD-10-CM

## 2015-04-25 DIAGNOSIS — H6120 Impacted cerumen, unspecified ear: Secondary | ICD-10-CM | POA: Insufficient documentation

## 2015-04-25 DIAGNOSIS — H6121 Impacted cerumen, right ear: Secondary | ICD-10-CM | POA: Diagnosis not present

## 2015-04-25 MED ORDER — CYCLOBENZAPRINE HCL 5 MG PO TABS: 5.0000 mg | ORAL_TABLET | Freq: Two times a day (BID) | ORAL | Status: DC | PRN

## 2015-04-25 NOTE — Patient Instructions (Signed)
Buy an OTC ear cleaning kit and continue to clean ears once a week.  Your eardrum appears normal once we removed the was impaction.   Cerumen Impaction The structures of the external ear canal secrete a waxy substance known as cerumen. Excess cerumen can build up in the ear canal, causing a condition known as cerumen impaction. Cerumen impaction can cause ear pain and disrupt the function of the ear. The rate of cerumen production differs for each individual. In certain individuals, the configuration of the ear canal may decrease his or her ability to naturally remove cerumen. CAUSES Cerumen impaction is caused by excessive cerumen production or buildup. RISK FACTORS  Frequent use of swabs to clean ears.  Having narrow ear canals.  Having eczema.  Being dehydrated. SIGNS AND SYMPTOMS  Diminished hearing.  Ear drainage.  Ear pain.  Ear itch. TREATMENT Treatment may involve:  Over-the-counter or prescription ear drops to soften the cerumen.  Removal of cerumen by a health care provider. This may be done with:  Irrigation with warm water. This is the most common method of removal.  Ear curettes and other instruments.  Surgery. This may be done in severe cases. HOME CARE INSTRUCTIONS  Take medicines only as directed by your health care provider.  Do not insert objects into the ear with the intent of cleaning the ear. PREVENTION  Do not insert objects into the ear, even with the intent of cleaning the ear. Removing cerumen as a part of normal hygiene is not necessary, and the use of swabs in the ear canal is not recommended.  Drink enough water to keep your urine clear or pale yellow.  Control your eczema if you have it. SEEK MEDICAL CARE IF:  You develop ear pain.  You develop bleeding from the ear.  The cerumen does not clear after you use ear drops as directed.   This information is not intended to replace advice given to you by your health care provider. Make  sure you discuss any questions you have with your health care provider.   Document Released: 05/31/2004 Document Revised: 05/14/2014 Document Reviewed: 12/08/2014 Elsevier Interactive Patient Education [employer].

## 2015-04-25 NOTE — Progress Notes (Signed)
   Subjective:    Patient ID: Samantha Bradley, female    DOB: [birth year], 49 y.o.   MRN: [id number]  HPI   Right ear hearing loss: pt presents with 1 week ago history of muffled hearing. She reports, this has happened before and she had a cerumen impaction. She has not tried anything otc because she is fearful of making it worse. She has mild left ear muffled as well.  No pain, dizziness, nausea, vomit, diarrhea, fever, chills or rash.    Past Medical History  Diagnosis Date  . Hypercholesterolemia   . Asthma   . Vitamin D deficiency   . Heme positive stool 2014    declined GI referral   Allergies  Allergen Reactions  . Penicillins Rash   Social History   Social History  . Marital Status: Single    Spouse Name: N/A  . Number of Children: N/A  . Years of Education: N/A   Occupational History  . Not on file.   Social History Main Topics  . Smoking status: Never Smoker   . Smokeless tobacco: Never Used  . Alcohol Use: Yes     Comment: OCC  . Drug Use: No  . Sexual Activity: Yes   Other Topics Concern  . Not on file   Social History Narrative   Single. G3P3.  In a relationship, female partner. She feels dafe in her relationship.   Moved to [place] about 12 years ago. 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   Wears seatbelt, has fire alarm in the home, no guns in the home.       Review of Systems Negative, with the exception of above mentioned in HPI     Objective:   Physical Exam BP 114/82 mmHg  Pulse 77  Temp(Src) 97.6 F (36.4 C) (Temporal)  Resp 18  Ht 5' (1.524 m)  Wt 139 lb (63.05 kg)  BMI 27.15 kg/m2  SpO2 97%  LMP 09/13/2012 Gen: Afebrile. No acute distress. Nontoxic in appearance. Well developed well nourished female.  HENT: AT. [place]. Bilateral TM, right cerumen impaction, left mild increase in ear wax. TM left WNL.  MMM, no oral lesions Eyes:Pupils Equal Round Reactive to light, Extraocular movements intact,  Conjunctiva without  redness, discharge or icterus.    Assessment & Plan:  Cerumen impaction, right/hearing loss - Ear Lavage, pt tolerated procdure well. Plastic ear curette used to remove remaining debris. Hearing returned to baseline and and exam WNL after removal of cerumen.   F/U as needed

## 2015-04-25 NOTE — Progress Notes (Signed)
Pre visit review using our clinic review tool, if applicable. No additional management support is needed unless otherwise documented below in the visit note. 

## 2015-05-03 ENCOUNTER — Ambulatory Visit: Payer: Self-pay | Admitting: Obstetrics

## 2015-05-16 ENCOUNTER — Ambulatory Visit (INDEPENDENT_AMBULATORY_CARE_PROVIDER_SITE_OTHER): Payer: BLUE CROSS/BLUE SHIELD | Admitting: Obstetrics

## 2015-05-16 ENCOUNTER — Encounter: Payer: Self-pay | Admitting: Obstetrics

## 2015-05-16 VITALS — BP 125/83 | HR 90 | Temp 98.0°F | Ht 60.0 in | Wt 138.0 lb

## 2015-05-16 DIAGNOSIS — R87810 Cervical high risk human papillomavirus (HPV) DNA test positive: Secondary | ICD-10-CM | POA: Diagnosis not present

## 2015-05-16 NOTE — Patient Instructions (Signed)
Human Papillomavirus Human papillomavirus (HPV) is the most common sexually transmitted infection (STI) and is highly contagious. HPV infections cause genital warts and cancers to the outlet of the womb (cervix), birth canal (vagina), opening of the birth canal (vulva), and anus. There are over 100 types of HPV. Unless wartlike lesions are present in the throat or there are genital warts that you can see or feel, HPV usually does not cause symptoms. It is possible to be infected for long periods and pass it on to others without knowing it. CAUSES  HPV is spread from person to person through sexual contact. This includes oral, vaginal, or anal sex. RISK FACTORS  Having unprotected sex. HPV can be spread by oral, vaginal, or anal sex.  Having several sex partners.  Having a sex partner who has other sex partners.  Having or having had another sexually transmitted infection. SIGNS AND SYMPTOMS  Most people carrying HPV do not have any symptoms. If symptoms are present, symptoms may include:  Wartlike lesions in the throat (from having oral sex).  Warts in the infected skin or mucous membranes.  Genital warts that may itch, burn, or bleed.  Genital warts that may be painful or bleed during sexual intercourse. DIAGNOSIS  If wartlike lesions are present in the throat or genital warts are present, your health care provider can usually diagnose HPV by physical examination.   Genital warts are easily seen with the naked eye.  Currently, there is no FDA-approved test to detect HPV in males.  In females, a Pap test can show cells that are infected with HPV.  In females, a scope can be used to view the cervix (colposcopy). A colposcopy can be performed if the pelvic exam or Pap test is abnormal. A sample of tissue may be removed (biopsy) during the colposcopy. TREATMENT  There is no treatment for the virus itself. However, there are treatments for the health problems and symptoms HPV can cause.  Your health care provider will follow you closely after you are treated. This is because the HPV can come back and may need treatment again. Treatment of HPV may include:   Medicines, which may be injected or applied in a cream, lotion, or gel form.  Use of a probe to apply extreme cold (cryotherapy).  Application of an intense beam of light (laser treatment).  Use of a probe to apply extreme heat (electrocautery).  Surgery. HOME CARE INSTRUCTIONS   Take medicines only as directed by your health care provider.  Use over-the-counter creams for itching or irritation as directed by your health care provider.  Keep all follow-up visits as directed by your health care provider. This is important.  Do not touch or scratch the warts.  Do not treat genital warts with medicines used for treating hand warts.  Do not have sex while you are being treated.  Do not douche or use tampons during treatment of HPV.  Tell your sex partner about your infection because he or she may also need treatment.  If you become pregnant, tell your health care provider that you have had HPV. Your health care provider will monitor you closely during pregnancy to be sure your baby is safe.  After treatment, use condoms during sex to prevent future infections.  Have only one sex partner.  Have a sex partner who does not have other sex partners. PREVENTION   Talk to your health care provider about getting the HPV vaccines. These vaccines prevent some HPV infections and cancers.   It is recommended that the vaccine be given to males and females between the ages of 9 and 26 years old. It will not work if you already have HPV, and it is not recommended for pregnant women.  A Pap test is done to screen for cervical cancer in women.  The first Pap test should be done at age 21 years.  Between ages 21 and 29 years, Pap tests are repeated every 2 years.  Beginning at age 30, you are advised to have a Pap test every  3 years as long as your past 3 Pap tests have been normal.  Some women have medical problems that increase the chance of getting cervical cancer. Talk to your health care provider about these problems. It is especially important to talk to your health care provider if a new problem develops soon after your last Pap test. In these cases, your health care provider may recommend more frequent screening and Pap tests.  The above recommendations are the same for women who have or have not gotten the vaccine for HPV.  If you had a hysterectomy for a problem that was not a cancer or a condition that could lead to cancer, then you no longer need Pap tests. However, even if you no longer need a Pap test, a regular exam is a good idea to make sure no other problems are starting.   If you are between the ages of 65 and 70 years and you have had normal Pap tests going back 10 years, you no longer need Pap tests. However, even if you no longer need a Pap test, a regular exam is a good idea to make sure no other problems are starting.  If you have had past treatment for cervical cancer or a condition that could lead to cancer, you need Pap tests and screening for cancer for at least 20 years after your treatment.  If Pap tests have been discontinued, risk factors (such as a new sexual partner)need to be reassessed to determine if screening should be resumed.  Some women may need screenings more often if they are at high risk for cervical cancer. SEEK MEDICAL CARE IF:   The treated skin becomes red, swollen, or painful.  You have a fever.  You feel generally ill.  You feel lumps or pimple-like projections in and around your genital area.  You develop bleeding of the vagina or the treatment area.  You have painful sexual intercourse. MAKE SURE YOU:   Understand these instructions.  Will watch your condition.  Will get help if you are not doing well or get worse.   This information is not  intended to replace advice given to you by your health care provider. Make sure you discuss any questions you have with your health care provider.   Document Released: 07/14/2003 Document Revised: 05/14/2014 Document Reviewed: 07/29/2013 Elsevier Interactive Patient Education 2016 [employer]  

## 2015-05-16 NOTE — Progress Notes (Signed)
Patient ID: Samantha Bradley, female   DOB: 1966-02-03, 50 y.o.   MRN: [id number]  Chief Complaint  Patient presents with  . Gynecologic Exam    Patient is in the office for follow up- she had had the pain for 1-2 months. Patient did have an abnormal pap. to pain in her abdomin. Patient reports the is gone    HPI Samantha Bradley is a 50 y.o. female.  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  Has had abnormal paps in the past, but has never had colposcopy or treatment for an abnormal pap. HPI  Past Medical History  Diagnosis Date  . Hypercholesterolemia   . Asthma   . Vitamin D deficiency   . Heme positive stool 2014    declined GI referral    Past Surgical History  Procedure Laterality Date  . Cesarean section    . Cholecystectomy    . Appendectomy    . Tonsillectomy and adenoidectomy    . Wisdom tooth extraction    . Tubal ligation      Family History  Problem Relation Age of Onset  . COPD Mother   . Asthma Mother   . Diabetes Father   . Cancer Father     MULTIPLE MYELOMA, LEUKEMIA  . Heart disease Father   . Diabetes Mother     Social History Social History  Substance Use Topics  . Smoking status: Never Smoker   . Smokeless tobacco: Never Used  . Alcohol Use: No     Comment: OCC    Allergies  Allergen Reactions  . Penicillins Rash    Current Outpatient Prescriptions  Medication Sig Dispense Refill  . aspirin 81 MG tablet Take 81 mg by mouth daily.    . cholecalciferol (VITAMIN D) 1000 UNITS tablet Take 1,000 Units by mouth daily.    . cyclobenzaprine (FLEXERIL) 5 MG tablet Take 1 tablet (5 mg total) by mouth 2 (two) times daily as needed for muscle spasms. 60 tablet 0  . fish oil-omega-3 fatty acids 1000 MG capsule Take 2 g by mouth daily.    . Multiple Vitamin (MULTIVITAMIN) tablet Take 1 tablet by mouth daily.    Marland Kitchen atorvastatin (LIPITOR) 20 MG tablet Take 1 tablet (20 mg total) by mouth daily. (Patient not taking: Reported on  05/16/2015) 90 tablet 3   No current facility-administered medications for this visit.    Review of Systems Review of Systems Constitutional: negative for fatigue and weight loss Respiratory: negative for cough and wheezing Cardiovascular: negative for chest pain, fatigue and palpitations Gastrointestinal: negative for abdominal pain and change in bowel habits Genitourinary:negative Integument/breast: negative for nipple discharge Musculoskeletal:negative for myalgias Neurological: negative for gait problems and tremors Behavioral/Psych: negative for abusive relationship, depression Endocrine: negative for temperature intolerance     Blood pressure 125/83, pulse 90, temperature 98 F (36.7 C), height 5' (1.524 m), weight 138 lb (62.596 kg), last menstrual period 03/03/2015.  Physical Exam Physical Exam: Deferred 100% of 10 min visit spent on counseling and coordination of care.   Data Reviewed Previous pap smear  Assessment     Positive High Risk HPV pap smear with negative abnormal cytology    Plan    Repeat pap 6 months    No orders of the defined types were placed in this encounter.   No orders of the defined types were placed in this encounter.

## 2015-10-06 ENCOUNTER — Ambulatory Visit (INDEPENDENT_AMBULATORY_CARE_PROVIDER_SITE_OTHER): Payer: BLUE CROSS/BLUE SHIELD | Admitting: Family Medicine

## 2015-10-06 ENCOUNTER — Encounter: Payer: Self-pay | Admitting: Family Medicine

## 2015-10-06 VITALS — BP 119/88 | HR 97 | Temp 98.0°F | Resp 16 | Ht 60.0 in | Wt 134.5 lb

## 2015-10-06 DIAGNOSIS — R229 Localized swelling, mass and lump, unspecified: Secondary | ICD-10-CM | POA: Diagnosis not present

## 2015-10-06 DIAGNOSIS — R51 Headache: Secondary | ICD-10-CM | POA: Diagnosis not present

## 2015-10-06 DIAGNOSIS — R519 Headache, unspecified: Secondary | ICD-10-CM
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
## 2015-10-06 NOTE — Progress Notes (Signed)
Pre visit review using our clinic review tool, if applicable. No additional management support is needed unless otherwise documented below in the visit note. 

## 2015-11-14 ENCOUNTER — Ambulatory Visit: Payer: BLUE CROSS/BLUE SHIELD | Admitting: Obstetrics

## 2016-01-30 ENCOUNTER — Encounter: Payer: Self-pay | Admitting: Family Medicine

## 2016-01-30 ENCOUNTER — Ambulatory Visit (INDEPENDENT_AMBULATORY_CARE_PROVIDER_SITE_OTHER): Payer: BLUE CROSS/BLUE SHIELD | Admitting: Family Medicine

## 2016-01-30 VITALS — BP 102/67 | HR 99 | Temp 98.2°F | Resp 20 | Ht 60.0 in | Wt 128.5 lb

## 2016-01-30 DIAGNOSIS — R928 Other abnormal and inconclusive findings on diagnostic imaging of breast: Secondary | ICD-10-CM

## 2016-01-30 DIAGNOSIS — Z0289 Encounter for other administrative examinations: Secondary | ICD-10-CM

## 2016-01-30 NOTE — Patient Instructions (Signed)
It was a pleasure seeing you again today.  Physical with PAP and fasting labs due after 03/22/2016.  Form completed today.   We will need to schedule your mammogram and US repeat as soon as possible. It was due June 2017.
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
## 2016-08-16 IMAGING — US US PELVIS COMPLETE
1 series · 13 of 25 positions shown · non-contrast
Comparison: None

CLINICAL DATA: His pre of abnormal Pap smear, left lower quadrant
tenderness on exam. Most recent menstrual period was sometime last
month.



[Series 1: us pelvis complete · 0.24mm/px · 13 of 56 slices shown]
[im 1/56]
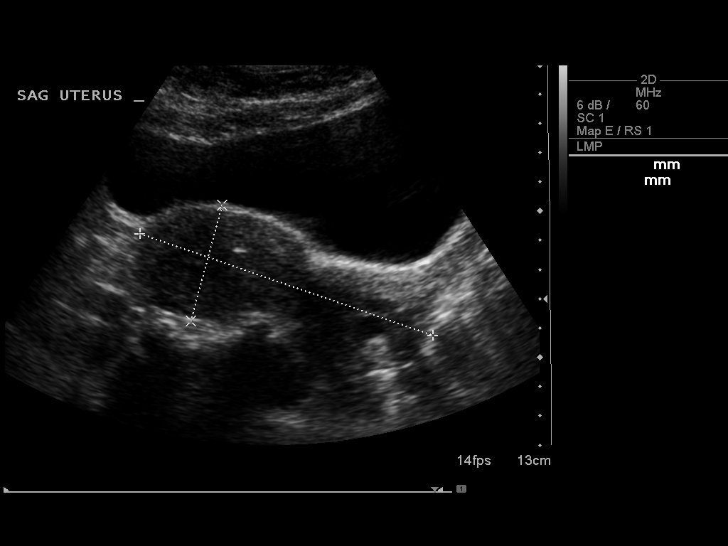
[im 5/56]
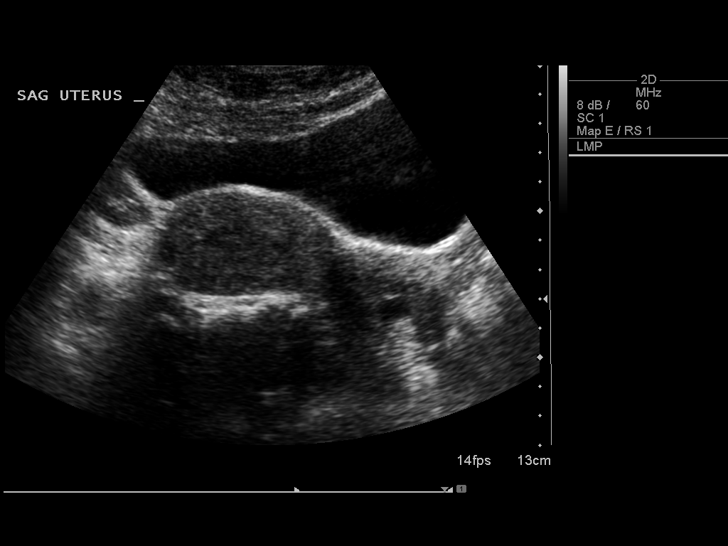
[im 10/56]
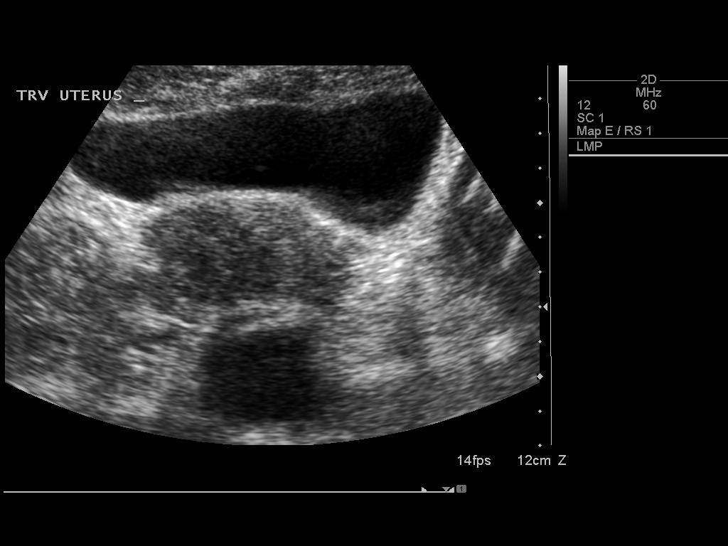
[im 14/56]
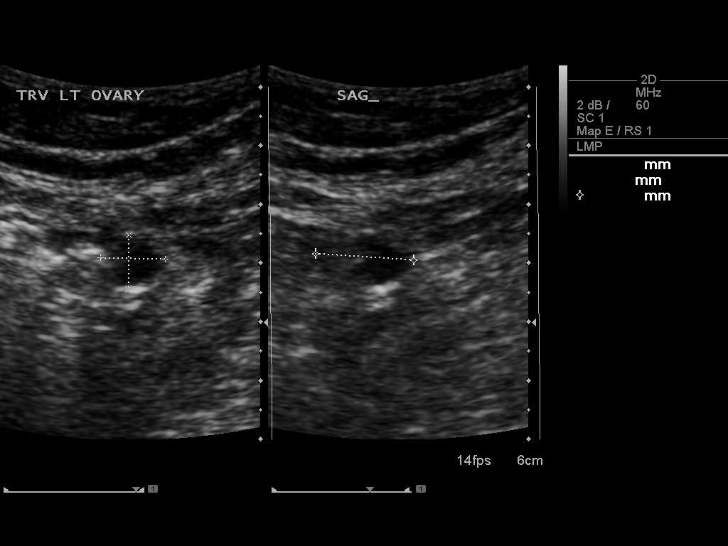
[im 19/56]
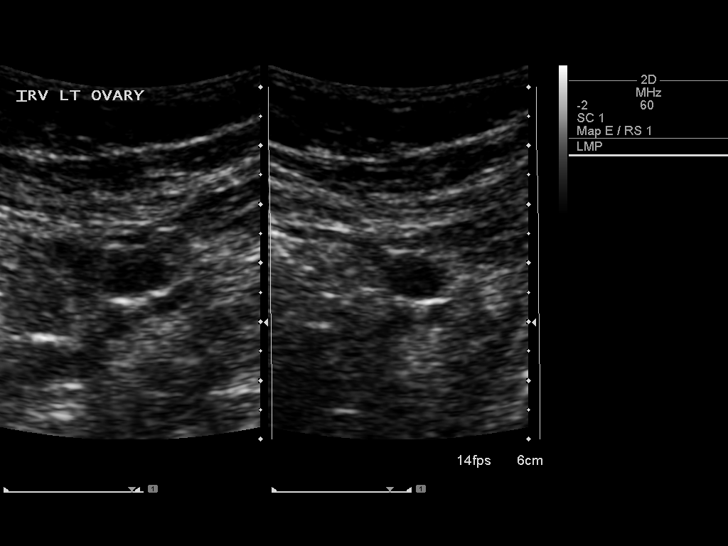
[im 23/56]
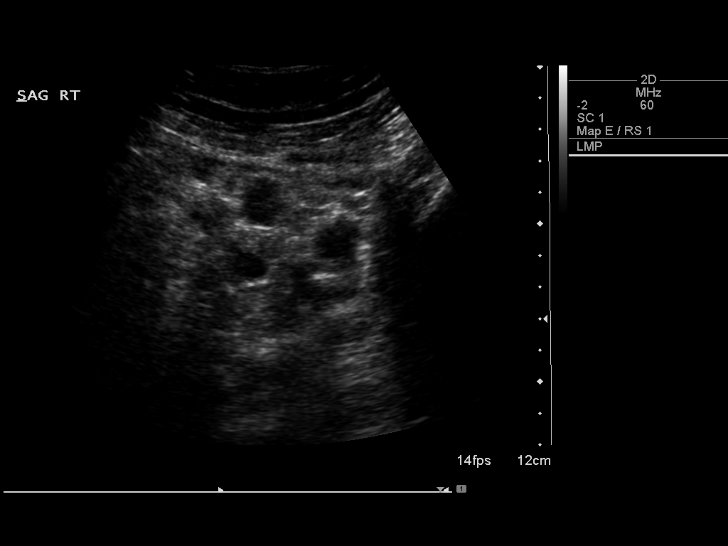
[im 28/56]
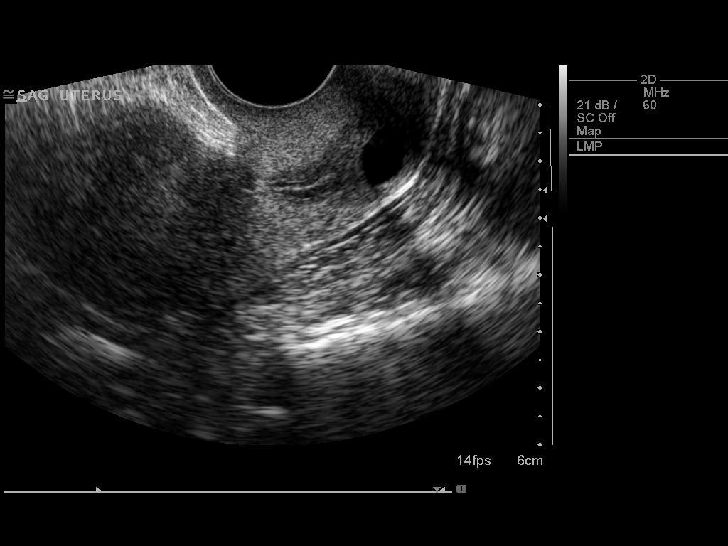
[im 33/56]
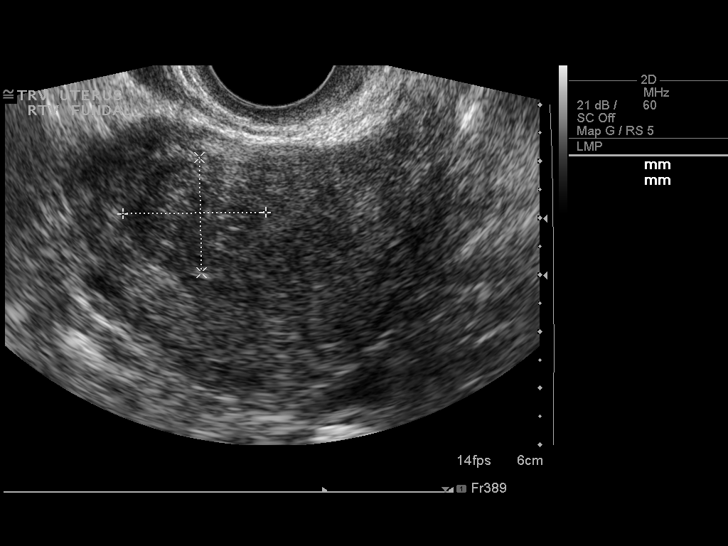
[im 37/56]
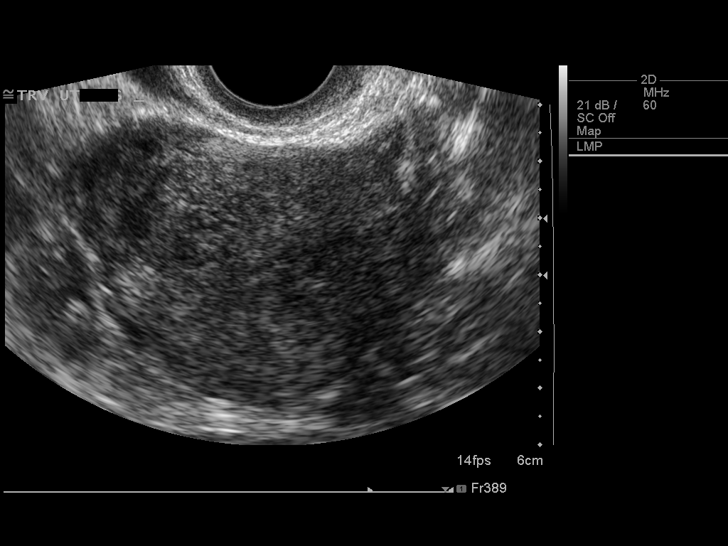
[im 42/56]
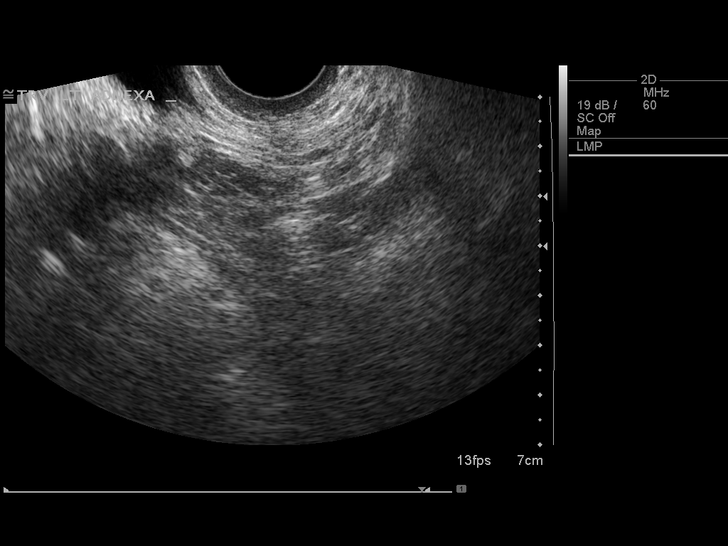
[im 46/56]
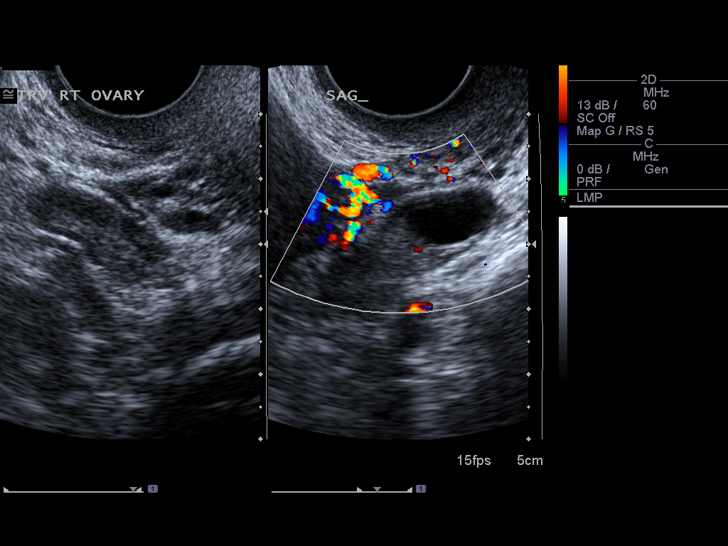
[im 51/56]
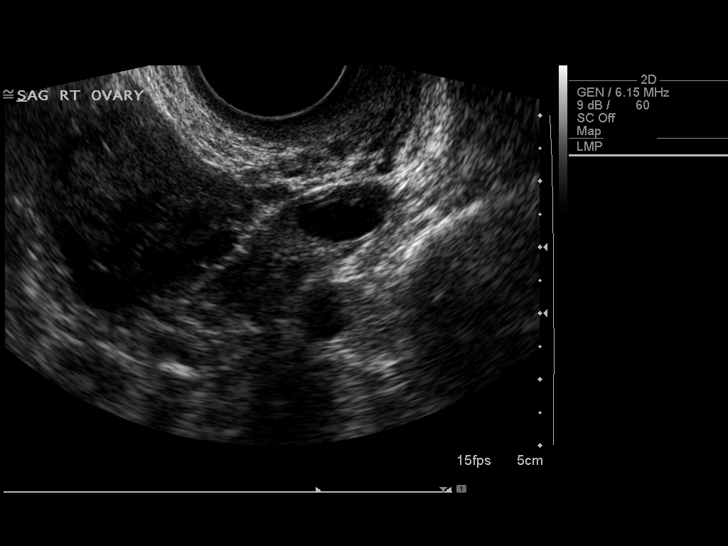
[im 56/56]
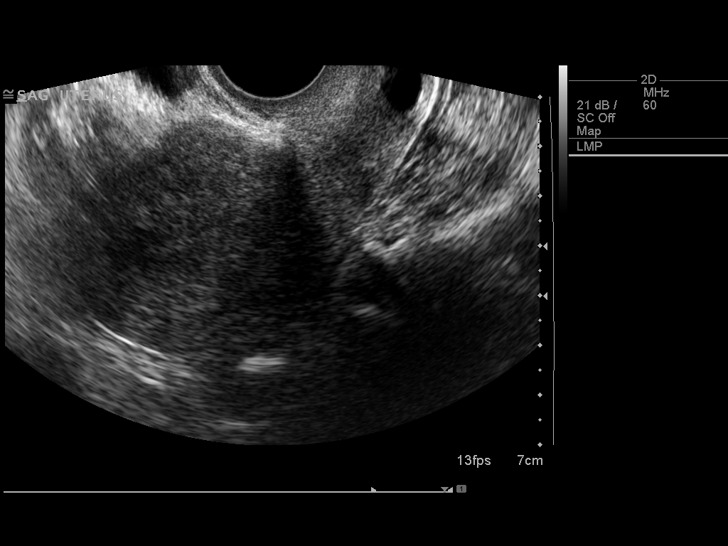

[13 of 25 positions shown; findings below may reference images not displayed]

FINDINGS: Uterus

Measurements: 8.9 x 5.2 x 6.3 cm. There is a hypoechoic focus in the
right aspect of the uterine fundus consistent with fibroid measuring
2.5 cm in greatest dimension.

Endometrium

Thickness: 3 mm.  No focal abnormality visualized.

Right ovary

Measurements: 2.3 x 0.8 x 2.1 cm. Normal appearance/no adnexal mass.

Left ovary

Measurements: 1.7 x 0.9 x 1.1 cm. Normal appearance/no adnexal mass.

Other findings

There is no free pelvic fluid.
IMPRESSION: 1. 2.5 cm submucosal fibroid in the right aspect of the uterine
fundus.
2. Normal appearance of the endometrial stripe.
3. Normal appearance of the ovaries with evidence of developing
follicles.

## 2016-09-19 ENCOUNTER — Encounter: Payer: Self-pay | Admitting: Gynecology

## 2017-03-06 ENCOUNTER — Encounter: Payer: Self-pay | Admitting: Family Medicine

## 2017-03-06 ENCOUNTER — Ambulatory Visit (INDEPENDENT_AMBULATORY_CARE_PROVIDER_SITE_OTHER): Payer: BLUE CROSS/BLUE SHIELD | Admitting: Family Medicine

## 2017-03-06 VITALS — BP 116/80 | HR 84 | Temp 98.0°F | Resp 20 | Ht 60.0 in | Wt 138.5 lb

## 2017-03-06 DIAGNOSIS — R928 Other abnormal and inconclusive findings on diagnostic imaging of breast: Secondary | ICD-10-CM

## 2017-03-06 DIAGNOSIS — E7849 Other hyperlipidemia: Secondary | ICD-10-CM

## 2017-03-06 DIAGNOSIS — M1990 Unspecified osteoarthritis, unspecified site: Secondary | ICD-10-CM | POA: Diagnosis not present

## 2017-03-06 DIAGNOSIS — Z1211 Encounter for screening for malignant neoplasm of colon: Secondary | ICD-10-CM

## 2017-03-06 DIAGNOSIS — Z2821 Immunization not carried out because of patient refusal: Secondary | ICD-10-CM

## 2017-03-06 DIAGNOSIS — Z0001 Encounter for general adult medical examination with abnormal findings: Secondary | ICD-10-CM | POA: Diagnosis not present

## 2017-03-06 LAB — COMPREHENSIVE METABOLIC PANEL
ALK PHOS: 55 U/L (ref 39–117)
ALT: 12 U/L (ref 0–35)
AST: 17 U/L (ref 0–37)
Albumin: 4.1 g/dL (ref 3.5–5.2)
BILIRUBIN TOTAL: 0.3 mg/dL (ref 0.2–1.2)
BUN: 20 mg/dL (ref 6–23)
CO2: 31 mEq/L (ref 19–32)
CREATININE: 0.76 mg/dL (ref 0.40–1.20)
Calcium: 10.1 mg/dL (ref 8.4–10.5)
Chloride: 103 mEq/L (ref 96–112)
GFR: 84.39 mL/min (ref >60.00)
GLUCOSE: 103 mg/dL — AB (ref 70–99)
Potassium: 4.4 mEq/L (ref 3.5–5.1)
Sodium: 140 mEq/L (ref 135–145)
TOTAL PROTEIN: 6.9 g/dL (ref 6.0–8.3)

## 2017-03-06 LAB — CBC WITH DIFFERENTIAL/PLATELET
BASOS ABS: 0.1 10*3/uL (ref 0.0–0.1)
Basophils Relative: 0.8 % (ref 0.0–3.0)
Eosinophils Absolute: 0.3 10*3/uL (ref 0.0–0.7)
Eosinophils Relative: 3.1 % (ref 0.0–5.0)
HEMATOCRIT: 44.3 % (ref 36.0–46.0)
Hemoglobin: 14.5 g/dL (ref 12.0–15.0)
LYMPHS ABS: 2 10*3/uL (ref 0.7–4.0)
LYMPHS PCT: 20.9 % (ref 12.0–46.0)
MCHC: 32.6 g/dL (ref 30.0–36.0)
MCV: 102.2 fl — AB (ref 78.0–100.0)
MONOS PCT: 8.7 % (ref 3.0–12.0)
Monocytes Absolute: 0.8 10*3/uL (ref 0.1–1.0)
NEUTROS PCT: 66.5 % (ref 43.0–77.0)
Neutro Abs: 6.4 10*3/uL (ref 1.4–7.7)
Platelets: 389 10*3/uL (ref 150.0–400.0)
RBC: 4.33 Mil/uL (ref 3.87–5.11)
RDW: 12.8 % (ref 11.5–15.5)
WBC: 9.6 10*3/uL (ref 4.0–10.5)

## 2017-03-06 LAB — LIPID PANEL
Cholesterol: 257 mg/dL — ABNORMAL HIGH (ref 0–200)
HDL: 52 mg/dL (ref >39.00)
LDL Cholesterol: 175 mg/dL — ABNORMAL HIGH (ref 0–99)
NONHDL: 205.27
Total CHOL/HDL Ratio: 5
Triglycerides: 152 mg/dL — ABNORMAL HIGH (ref 0.0–149.0)
VLDL: 30.4 mg/dL (ref 0.0–40.0)

## 2017-03-06 LAB — HEMOGLOBIN A1C: HEMOGLOBIN A1C: 5.9 % (ref 4.6–6.5)

## 2017-03-06 LAB — TSH: TSH: 2.4 u[IU]/mL (ref 0.35–4.50)

## 2017-03-06 MED ORDER — ATORVASTATIN CALCIUM 20 MG PO TABS: 20.0000 mg | ORAL_TABLET | Freq: Every day | ORAL | 3 refills | Status: DC

## 2017-03-06 MED ORDER — MELOXICAM 15 MG PO TABS: 15.0000 mg | ORAL_TABLET | Freq: Every day | ORAL | 3 refills | Status: DC

## 2017-03-06 NOTE — Progress Notes (Signed)
Patient ID: Samantha Bradley, female  DOB: 04/01/1966, 51 y.o.   MRN: [id number] Patient Care Team    Relationship Specialty Notifications Start End  Ma Hillock, DO PCP - General Family Medicine  02/03/15     Chief Complaint  Patient presents with  . Annual Exam    Subjective:  Samantha Bradley is a 51 y.o.  Female  present for CPE. All past medical history, surgical history, allergies, family history, immunizations, medications and social history were updated in the electronic medical record today. All recent labs, ED visits and hospitalizations within the last year were reviewed.  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 She has nodules and deformities of her fingers. Her mother had rheumatoid arthritis. 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   Health maintenance:  Colonoscopy: Never had screening. Patient adamantly against colonoscopy. She was counseled on benefits today. She is agreeable to complete fecal occult blood cards consider colonoscopy if positive. Mammogram: completed: 04/15/2015, small left breast nodule, ultrasound completed showed likely benign small left breast nodule. Recommend follow-up 6-12 months, patient is not interested in completing mammogram. She will call to let us know if she changes her mind. Cervical cancer screening: last pap: 03/13/2014 normal with positive high risk HPV completed by this provider. Patient was encouraged to have repeat Pap in 12 months, which she did not schedule. She states that she understands the risks, but does not want to have Pap completed. Immunizations: tdap UTD 01/30/2015, Influenza declined (encouraged yearly), shingrix declined Infectious disease screening: HIV and Hep C pt reports she's had this completed in the past and was normal. DEXA: n/a Assistive device: None Oxygen use: None Patient has a Dental  home. Hospitalizations/ED visits: None  Depression screen [facility] 2/9 03/06/2017 01/30/2016 03/22/2015  Decreased Interest 0 0 0  Down, Depressed, Hopeless 0 0 0  PHQ - 2 Score 0 0 0   No flowsheet data found.   Current Exercise Habits: The patient does not participate in regular exercise at present Exercise limited by: None identified   Immunization History  Administered Date(s) Administered  . Tdap 01/30/2015     Past Medical History:  Diagnosis Date  . Asthma   . Heme positive stool 2014   declined GI referral  . Hypercholesterolemia   . Vitamin D deficiency    Allergies  Allergen Reactions  . Penicillins Rash   Past Surgical History:  Procedure Laterality Date  . APPENDECTOMY    . CESAREAN SECTION    . CHOLECYSTECTOMY    . TONSILLECTOMY AND ADENOIDECTOMY    . TUBAL LIGATION    . WISDOM TOOTH EXTRACTION     Family History  Problem Relation Age of Onset  . COPD Mother   . Asthma Mother   . Diabetes Mother   . Diabetes Father   . Cancer Father        MULTIPLE MYELOMA, LEUKEMIA  . Heart disease Father    Social History   Social History  . Marital status: Single    Spouse name: N/A  . Number of children: N/A  . Years of education: N/A   Occupational History  . Not on file.   Social History Main Topics  . Smoking status: Current Some Day Smoker    Types: Cigarettes  . Smokeless tobacco: Never Used  . Alcohol use 0.0 oz/week  Comment: occasional  . Drug use: No  . Sexual activity: Yes    Partners: Male    Birth control/ protection: Surgical   Other Topics Concern  . Not on file   Social History Narrative   Single. G3P3.  In a relationship, female partner. She feels dafe in her relationship.   Moved to [place] about 12 years ago. 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   Wears seatbelt, has fire alarm in the home, no guns in the home.       Allergies as of 03/06/2017      Reactions   Penicillins Rash      Medication  List       Accurate as of 03/06/17  3:13 PM. Always use your most recent med list.          aspirin 81 MG tablet Take 81 mg by mouth daily.   atorvastatin 20 MG tablet Commonly known as:  LIPITOR Take 1 tablet (20 mg total) by mouth daily.   cholecalciferol 1000 units tablet Commonly known as:  VITAMIN D Take 1,000 Units by mouth daily.   fish oil-omega-3 fatty acids 1000 MG capsule Take 2 g by mouth daily.   meloxicam 15 MG tablet Commonly known as:  MOBIC Take 1 tablet (15 mg total) by mouth daily.   multivitamin tablet Take 1 tablet by mouth daily.       All past medical history, surgical history, allergies, family history, immunizations andmedications were updated in the EMR today and reviewed under the history and medication portions of their EMR.     No results found for this or any previous visit (from the past 2160 hour(s)).  US Breast Ltd Uni Left Inc Axilla  Result Date: 04/25/2015 CLINICAL DATA:  Left breast lower outer quadrant focal asymmetry seen on most recent screening mammography. EXAM: DIGITAL DIAGNOSTIC LEFT MAMMOGRAM WITH 3D TOMOSYNTHESIS WITH CAD ULTRASOUND LEFT BREAST COMPARISON:  Previous exam(s). ACR Breast Density Category c: The breast tissue is heterogeneously dense, which may obscure small masses. FINDINGS: Additional mammographic views of the left breast demonstrate persistent sub cm low-density benign-appearing circumscribed nodule in the left breast lower outer quadrant, middle depth. Mammographic images were processed with CAD. On physical exam, no suspicious masses are found. Targeted ultrasound is performed, showing no suspicious masses or shadowing lesions. No sonographic correlation was seen to the circumscribed low-density benign-appearing mammographic nodule. IMPRESSION: Left breast lower outer quadrant sub cm probably benign nodule, without sonographic correlation, for which six-month follow-up is recommended. RECOMMENDATION: Diagnostic  mammogram and possibly ultrasound of the left breast in 6 months. (Code:FI-L-76M) I have discussed the findings and recommendations with the patient. Results were also provided in writing at the conclusion of the visit. If applicable, a reminder letter will be sent to the patient regarding the next appointment. BI-RADS CATEGORY  3: Probably benign. Electronically Signed   By: Fidela Salisbury M.D.   On: 04/25/2015 12:45   Mm Diag Breast Tomo Uni Left  Result Date: 04/25/2015 CLINICAL DATA:  Left breast lower outer quadrant focal asymmetry seen on most recent screening mammography. EXAM: DIGITAL DIAGNOSTIC LEFT MAMMOGRAM WITH 3D TOMOSYNTHESIS WITH CAD ULTRASOUND LEFT BREAST COMPARISON:  Previous exam(s). ACR Breast Density Category c: The breast tissue is heterogeneously dense, which may obscure small masses. FINDINGS: Additional mammographic views of the left breast demonstrate persistent sub cm low-density benign-appearing circumscribed nodule in the left breast lower outer quadrant, middle depth. Mammographic images were processed with CAD. On physical exam,  no suspicious masses are found. Targeted ultrasound is performed, showing no suspicious masses or shadowing lesions. No sonographic correlation was seen to the circumscribed low-density benign-appearing mammographic nodule. IMPRESSION: Left breast lower outer quadrant sub cm probably benign nodule, without sonographic correlation, for which six-month follow-up is recommended. RECOMMENDATION: Diagnostic mammogram and possibly ultrasound of the left breast in 6 months. (Code:FI-L-27M) I have discussed the findings and recommendations with the patient. Results were also provided in writing at the conclusion of the visit. If applicable, a reminder letter will be sent to the patient regarding the next appointment. BI-RADS CATEGORY  3: Probably benign. Electronically Signed   By: Fidela Salisbury M.D.   On: 04/25/2015 12:45     ROS: 14 pt review of  systems performed and negative (unless mentioned in an HPI)  Objective: BP 116/80 (BP Location: Right Arm, Patient Position: Sitting, Cuff Size: Normal)   Pulse 84   Temp 98 F (36.7 C)   Resp 20   Ht 5' (1.524 m)   Wt 138 lb 8 oz (62.8 kg)   SpO2 100%   BMI 27.05 kg/m  Gen: Afebrile. No acute distress. Nontoxic in appearance, well-developed, well-nourished,  Pleasant caucasian female.  HENT: AT. [place]. Bilateral TM visualized and normal in appearance, normal external auditory canal. MMM, no oral lesions, adequate dentition. Bilateral nares within normal limits. Throat without erythema, ulcerations or exudates. no Cough on exam, no hoarseness on exam. Eyes:Pupils Equal Round Reactive to light, Extraocular movements intact,  Conjunctiva without redness, discharge or icterus. Neck/lymp/endocrine: Supple,no lymphadenopathy, no thyromegaly CV: RRR no murmur, no edema, +2/4 P posterior tibialis pulses. no carotid bruits. No JVD. Chest: CTAB, no wheeze, rhonchi or crackles. normal Respiratory effort. good Air movement. Abd: Soft. round. NTND. BS present. no Masses palpated. No hepatosplenomegaly. No rebound tenderness or guarding. Skin: no rashes, purpura or petechiae. Warm and well-perfused. Skin intact. Neuro/Msk:  Normal gait. PERLA. EOMi. Alert. Oriented x3.  Cranial nerves II through XII intact. Muscle strength 5/5 upper/lower extremity. DTRs equal bilaterally. Bilateral hands with DIP joint deformities, ulnar deviation, joint nodules Psych: Normal affect, dress and demeanor. Normal speech. Normal thought content and judgment. Breast: pt deferred GYN: pt deferred  No exam data present  Assessment/plan: Samantha Bradley is a 51 y.o. female present for CPE  hyperlipidemia - pt use to take Lipitor, she states she ran out of the medicine so she stopped taking it. She is agreeable to restart. - CBC w/Diff - Comp Met (CMET) - Lipid panel - HgB A1c - atorvastatin (LIPITOR) 20 MG tablet; Take  1 tablet (20 mg total) by mouth daily.  Dispense: 90 tablet; Refill: 3 - TSH Abnormal mammogram of left breast - Patient was counseled on the need a follow-up mammogram given her abnormal left breast ultrasound prior. She understands the risk, she was encouraged to schedule and still declined mammogram screening at this time. She will call in if she changes her mind. Arthritis - Her mother has a history of rheumatoid arthritis. This is very well possible the beginning of rheumatoid arthritis. She would like to wait to move forward with any type of testing or rheumatological referral. Discussed daily anti-inflammatory use. She is agreeable to try meloxicam. - meloxicam (MOBIC) 15 MG tablet; Take 1 tablet (15 mg total) by mouth daily.  Dispense: 90 tablet; Refill: 3 Colon cancer screening - She declined any colon cancer screenings, however she is agreeable to complete the Hemoccult cards and if positive she will think about having a  colonoscopy - Hemoccult Cards (X3 cards); Future Encounter for general adult medical examination with abnormal findings Patient was encouraged to exercise greater than 150 minutes a week. Patient was encouraged to choose a diet filled with fresh fruits and vegetables, and lean meats. AVS provided to patient today for education/recommendation on gender specific health and safety maintenance. Declined flu shot today. Declined shingle shot today. Up-to-date tetanus. Declined follow-up mammogram. Declined colonoscopy, agreeable to be fecal occult blood test and will consider colonoscopy if positive. ID screenings completed per patient.  Return in about 1 year (around 03/06/2018) for CPE.  Electronically signed by: Howard Pouch, DO [place]

## 2017-03-06 NOTE — Patient Instructions (Signed)
Start mobic once a day with food for arthritis.   Complete the cards provided today and return.   We will call you with labs once resulted   I do recommend you follow up with the mammogram.    Please take a multivitamin with extra vit d and calcium.   Health Maintenance, Female Adopting a healthy lifestyle and getting preventive care can go a long way to promote health and wellness. Talk with your health care provider about what schedule of regular examinations is right for you. This is a good chance for you to check in with your provider about disease prevention and staying healthy. In between checkups, there are plenty of things you can do on your own. Experts have done a lot of research about which lifestyle changes and preventive measures are most likely to keep you healthy. Ask your health care provider for more information. Weight and diet Eat a healthy diet  Be sure to include plenty of vegetables, fruits, low-fat dairy products, and lean protein.  Do not eat a lot of foods high in solid fats, added sugars, or salt.  Get regular exercise. This is one of the most important things you can do for your health. ? Most adults should exercise for at least 150 minutes each week. The exercise should increase your heart rate and make you sweat (moderate-intensity exercise). ? Most adults should also do strengthening exercises at least twice a week. This is in addition to the moderate-intensity exercise.  Maintain a healthy weight  Body mass index (BMI) is a measurement that can be used to identify possible weight problems. It estimates body fat based on height and weight. Your health care provider can help determine your BMI and help you achieve or maintain a healthy weight.  For females 24 years of age and older: ? A BMI below 18.5 is considered underweight. ? A BMI of 18.5 to 24.9 is normal. ? A BMI of 25 to 29.9 is considered overweight. ? A BMI of 30 and above is considered  obese.  Watch levels of cholesterol and blood lipids  You should start having your blood tested for lipids and cholesterol at 53 years of age, then have this test every 5 years.  You may need to have your cholesterol levels checked more often if: ? Your lipid or cholesterol levels are high. ? You are older than 53 years of age. ? You are at high risk for heart disease.  Cancer screening Lung Cancer  Lung cancer screening is recommended for adults 44-35 years old who are at high risk for lung cancer because of a history of smoking.  A yearly low-dose CT scan of the lungs is recommended for people who: ? Currently smoke. ? Have quit within the past 15 years. ? Have at least a 30-pack-year history of smoking. A pack year is smoking an average of one pack of cigarettes a day for 1 year.  Yearly screening should continue until it has been 15 years since you quit.  Yearly screening should stop if you develop a health problem that would prevent you from having lung cancer treatment.  Breast Cancer  Practice breast self-awareness. This means understanding how your breasts normally appear and feel.  It also means doing regular breast self-exams. Let your health care provider know about any changes, no matter how small.  If you are in your 20s or 30s, you should have a clinical breast exam (CBE) by a health care provider every 1-3 years  as part of a regular health exam.  If you are 54 or older, have a CBE every year. Also consider having a breast X-ray (mammogram) every year.  If you have a family history of breast cancer, talk to your health care provider about genetic screening.  If you are at high risk for breast cancer, talk to your health care provider about having an MRI and a mammogram every year.  Breast cancer gene (BRCA) assessment is recommended for women who have family members with BRCA-related cancers. BRCA-related cancers  include: ? Breast. ? Ovarian. ? Tubal. ? Peritoneal cancers.  Results of the assessment will determine the need for genetic counseling and BRCA1 and BRCA2 testing.  Cervical Cancer Your health care provider may recommend that you be screened regularly for cancer of the pelvic organs (ovaries, uterus, and vagina). This screening involves a pelvic examination, including checking for microscopic changes to the surface of your cervix (Pap test). You may be encouraged to have this screening done every 3 years, beginning at age 69.  For women ages 40-65, health care providers may recommend pelvic exams and Pap testing every 3 years, or they may recommend the Pap and pelvic exam, combined with testing for human papilloma virus (HPV), every 5 years. Some types of HPV increase your risk of cervical cancer. Testing for HPV may also be done on women of any age with unclear Pap test results.  Other health care providers may not recommend any screening for nonpregnant women who are considered low risk for pelvic cancer and who do not have symptoms. Ask your health care provider if a screening pelvic exam is right for you.  If you have had past treatment for cervical cancer or a condition that could lead to cancer, you need Pap tests and screening for cancer for at least 20 years after your treatment. If Pap tests have been discontinued, your risk factors (such as having a new sexual partner) need to be reassessed to determine if screening should resume. Some women have medical problems that increase the chance of getting cervical cancer. In these cases, your health care provider may recommend more frequent screening and Pap tests.  Colorectal Cancer  This type of cancer can be detected and often prevented.  Routine colorectal cancer screening usually begins at 53 years of age and continues through 53 years of age.  Your health care provider may recommend screening at an earlier age if you have risk factors  for colon cancer.  Your health care provider may also recommend using home test kits to check for hidden blood in the stool.  A small camera at the end of a tube can be used to examine your colon directly (sigmoidoscopy or colonoscopy). This is done to check for the earliest forms of colorectal cancer.  Routine screening usually begins at age 60.  Direct examination of the colon should be repeated every 5-10 years through 53 years of age. However, you may need to be screened more often if early forms of precancerous polyps or small growths are found.  Skin Cancer  Check your skin from head to toe regularly.  Tell your health care provider about any new moles or changes in moles, especially if there is a change in a mole's shape or color.  Also tell your health care provider if you have a mole that is larger than the size of a pencil eraser.  Always use sunscreen. Apply sunscreen liberally and repeatedly throughout the day.  Protect yourself by  wearing long sleeves, pants, a wide-brimmed hat, and sunglasses whenever you are outside.  Heart disease, diabetes, and high blood pressure  High blood pressure causes heart disease and increases the risk of stroke. High blood pressure is more likely to develop in: ? People who have blood pressure in the high end of the normal range (130-139/85-89 mm Hg). ? People who are overweight or obese. ? People who are African American.  If you are 48-51 years of age, have your blood pressure checked every 3-5 years. If you are 17 years of age or older, have your blood pressure checked every year. You should have your blood pressure measured twice-once when you are at a hospital or clinic, and once when you are not at a hospital or clinic. Record the average of the two measurements. To check your blood pressure when you are not at a hospital or clinic, you can use: ? An automated blood pressure machine at a pharmacy. ? A home blood pressure monitor.  If  you are between 67 years and 58 years old, ask your health care provider if you should take aspirin to prevent strokes.  Have regular diabetes screenings. This involves taking a blood sample to check your fasting blood sugar level. ? If you are at a normal weight and have a low risk for diabetes, have this test once every three years after 53 years of age. ? If you are overweight and have a high risk for diabetes, consider being tested at a younger age or more often. Preventing infection Hepatitis B  If you have a higher risk for hepatitis B, you should be screened for this virus. You are considered at high risk for hepatitis B if: ? You were born in a country where hepatitis B is common. Ask your health care provider which countries are considered high risk. ? Your parents were born in a high-risk country, and you have not been immunized against hepatitis B (hepatitis B vaccine). ? You have HIV or AIDS. ? You use needles to inject street drugs. ? You live with someone who has hepatitis B. ? You have had sex with someone who has hepatitis B. ? You get hemodialysis treatment. ? You take certain medicines for conditions, including cancer, organ transplantation, and autoimmune conditions.  Hepatitis C  Blood testing is recommended for: ? Everyone born from [birth year] through 1967. ? Anyone with known risk factors for hepatitis C.  Sexually transmitted infections (STIs)  You should be screened for sexually transmitted infections (STIs) including gonorrhea and chlamydia if: ? You are sexually active and are younger than 53 years of age. ? You are older than 53 years of age and your health care provider tells you that you are at risk for this type of infection. ? Your sexual activity has changed since you were last screened and you are at an increased risk for chlamydia or gonorrhea. Ask your health care provider if you are at risk.  If you do not have HIV, but are at risk, it may be recommended  that you take a prescription medicine daily to prevent HIV infection. This is called pre-exposure prophylaxis (PrEP). You are considered at risk if: ? You are sexually active and do not regularly use condoms or know the HIV status of your partner(s). ? You take drugs by injection. ? You are sexually active with a partner who has HIV.  Talk with your health care provider about whether you are at high risk of being infected with  HIV. If you choose to begin PrEP, you should first be tested for HIV. You should then be tested every 3 months for as long as you are taking PrEP. Pregnancy  If you are premenopausal and you may become pregnant, ask your health care provider about preconception counseling.  If you may become pregnant, take 400 to 800 micrograms (mcg) of folic acid every day.  If you want to prevent pregnancy, talk to your health care provider about birth control (contraception). Osteoporosis and menopause  Osteoporosis is a disease in which the bones lose minerals and strength with aging. This can result in serious bone fractures. Your risk for osteoporosis can be identified using a bone density scan.  If you are 21 years of age or older, or if you are at risk for osteoporosis and fractures, ask your health care provider if you should be screened.  Ask your health care provider whether you should take a calcium or vitamin D supplement to lower your risk for osteoporosis.  Menopause may have certain physical symptoms and risks.  Hormone replacement therapy may reduce some of these symptoms and risks. Talk to your health care provider about whether hormone replacement therapy is right for you. Follow these instructions at home:  Schedule regular health, dental, and eye exams.  Stay current with your immunizations.  Do not use any tobacco products including cigarettes, chewing tobacco, or electronic cigarettes.  If you are pregnant, do not drink alcohol.  If you are  breastfeeding, limit how much and how often you drink alcohol.  Limit alcohol intake to no more than 1 drink per day for nonpregnant women. One drink equals 12 ounces of beer, 5 ounces of wine, or 1 ounces of hard liquor.  Do not use street drugs.  Do not share needles.  Ask your health care provider for help if you need support or information about quitting drugs.  Tell your health care provider if you often feel depressed.  Tell your health care provider if you have ever been abused or do not feel safe at home. This information is not intended to replace advice given to you by your health care provider. Make sure you discuss any questions you have with your health care provider. Document Released: 11/06/2010 Document Revised: 09/29/2015 Document Reviewed: 01/25/2015 Elsevier Interactive Patient Education  [employer].

## 2017-03-14 ENCOUNTER — Other Ambulatory Visit: Payer: BLUE CROSS/BLUE SHIELD

## 2017-03-14 DIAGNOSIS — Z1211 Encounter for screening for malignant neoplasm of colon: Secondary | ICD-10-CM

## 2017-03-14 LAB — HEMOCCULT SLIDES (X 3 CARDS)
FECAL OCCULT BLD: NEGATIVE
OCCULT 1: NEGATIVE
OCCULT 2: NEGATIVE
OCCULT 3: NEGATIVE
OCCULT 4: NEGATIVE
OCCULT 5: NEGATIVE

## 2017-05-08 ENCOUNTER — Other Ambulatory Visit: Payer: Self-pay | Admitting: Family Medicine

## 2017-05-08 NOTE — Telephone Encounter (Signed)
Left message for patient to contact her pharmacy she has refills on file there.

## 2017-05-08 NOTE — Telephone Encounter (Signed)
Copied from CRM [id number]. Topic: Quick Communication - Rx Refill/Question >> May 08, 2017 11:10 AM Alexander BergeronBarksdale, Harvey B wrote: Contact pharmacy-yes Pt is requesting Rx for meloxicam (MOBIC) 15 MG tablet [[id number]][[id number]] and atorvastatin (LIPITOR) 20 MG tablet [[id number]][[id number]], contact pt or pharmacy if needed

## 2017-06-12 DIAGNOSIS — R062 Wheezing: Secondary | ICD-10-CM | POA: Diagnosis not present

## 2017-06-12 DIAGNOSIS — J209 Acute bronchitis, unspecified: Secondary | ICD-10-CM | POA: Diagnosis not present

## 2018-03-07 ENCOUNTER — Encounter: Payer: Self-pay | Admitting: Family Medicine

## 2018-03-07 ENCOUNTER — Ambulatory Visit (INDEPENDENT_AMBULATORY_CARE_PROVIDER_SITE_OTHER): Payer: BLUE CROSS/BLUE SHIELD | Admitting: Family Medicine

## 2018-03-07 VITALS — BP 138/82 | HR 83 | Temp 98.2°F | Resp 20 | Ht 60.0 in | Wt 127.0 lb

## 2018-03-07 DIAGNOSIS — F17200 Nicotine dependence, unspecified, uncomplicated: Secondary | ICD-10-CM

## 2018-03-07 DIAGNOSIS — Z130 Encounter for screening for diseases of the blood and blood-forming organs and certain disorders involving the immune mechanism: Secondary | ICD-10-CM

## 2018-03-07 DIAGNOSIS — Z131 Encounter for screening for diabetes mellitus: Secondary | ICD-10-CM

## 2018-03-07 DIAGNOSIS — E7849 Other hyperlipidemia: Secondary | ICD-10-CM

## 2018-03-07 DIAGNOSIS — Z79899 Other long term (current) drug therapy: Secondary | ICD-10-CM

## 2018-03-07 DIAGNOSIS — Z1211 Encounter for screening for malignant neoplasm of colon: Secondary | ICD-10-CM

## 2018-03-07 DIAGNOSIS — Z0000 Encounter for general adult medical examination without abnormal findings: Secondary | ICD-10-CM | POA: Diagnosis not present

## 2018-03-07 DIAGNOSIS — R7309 Other abnormal glucose: Secondary | ICD-10-CM

## 2018-03-07 DIAGNOSIS — Z87891 Personal history of nicotine dependence: Secondary | ICD-10-CM | POA: Insufficient documentation

## 2018-03-07 DIAGNOSIS — E785 Hyperlipidemia, unspecified: Secondary | ICD-10-CM

## 2018-03-07 LAB — CBC WITH DIFFERENTIAL/PLATELET
Basophils Absolute: 0.1 10*3/uL (ref 0.0–0.1)
Basophils Relative: 0.9 % (ref 0.0–3.0)
EOS ABS: 0.4 10*3/uL (ref 0.0–0.7)
Eosinophils Relative: 2.6 % (ref 0.0–5.0)
HEMATOCRIT: 44.3 % (ref 36.0–46.0)
Hemoglobin: 14.7 g/dL (ref 12.0–15.0)
LYMPHS PCT: 11.6 % — AB (ref 12.0–46.0)
Lymphs Abs: 1.7 10*3/uL (ref 0.7–4.0)
MCHC: 33.3 g/dL (ref 30.0–36.0)
MCV: 101.7 fl — AB (ref 78.0–100.0)
MONOS PCT: 8.9 % (ref 3.0–12.0)
Monocytes Absolute: 1.3 10*3/uL — ABNORMAL HIGH (ref 0.1–1.0)
Neutro Abs: 11.2 10*3/uL — ABNORMAL HIGH (ref 1.4–7.7)
Neutrophils Relative %: 76 % (ref 43.0–77.0)
PLATELETS: 398 10*3/uL (ref 150.0–400.0)
RBC: 4.36 Mil/uL (ref 3.87–5.11)
RDW: 13 % (ref 11.5–15.5)
WBC: 14.7 10*3/uL — ABNORMAL HIGH (ref 4.0–10.5)

## 2018-03-07 LAB — COMPREHENSIVE METABOLIC PANEL
ALK PHOS: 61 U/L (ref 39–117)
ALT: 17 U/L (ref 0–35)
AST: 25 U/L (ref 0–37)
Albumin: 4.5 g/dL (ref 3.5–5.2)
BUN: 15 mg/dL (ref 6–23)
CALCIUM: 10.6 mg/dL — AB (ref 8.4–10.5)
CO2: 31 meq/L (ref 19–32)
Chloride: 102 mEq/L (ref 96–112)
Creatinine, Ser: 0.79 mg/dL (ref 0.40–1.20)
GFR: 80.4 mL/min (ref >60.00)
Glucose, Bld: 101 mg/dL — ABNORMAL HIGH (ref 70–99)
Potassium: 4.4 mEq/L (ref 3.5–5.1)
Sodium: 141 mEq/L (ref 135–145)
TOTAL PROTEIN: 6.9 g/dL (ref 6.0–8.3)
Total Bilirubin: 0.4 mg/dL (ref 0.2–1.2)

## 2018-03-07 LAB — LIPID PANEL
Cholesterol: 162 mg/dL (ref 0–200)
HDL: 55.8 mg/dL (ref >39.00)
LDL Cholesterol: 86 mg/dL (ref 0–99)
NonHDL: 105.78
TRIGLYCERIDES: 101 mg/dL (ref 0.0–149.0)
Total CHOL/HDL Ratio: 3
VLDL: 20.2 mg/dL (ref 0.0–40.0)

## 2018-03-07 LAB — HEMOGLOBIN A1C: Hgb A1c MFr Bld: 6 % (ref 4.6–6.5)

## 2018-03-07 LAB — TSH: TSH: 2.89 u[IU]/mL (ref 0.35–4.50)

## 2018-03-07 MED ORDER — ATORVASTATIN CALCIUM 20 MG PO TABS
20.0000 mg | ORAL_TABLET | Freq: Every day | ORAL | 3 refills | Status: DC
Start: 2018-03-07 — End: 2019-03-09

## 2018-03-07 NOTE — Progress Notes (Signed)
Patient ID: Samantha Bradley, female  DOB: June 07, 1965, 51 y.o.   MRN: [id number] Patient Care Team    Relationship Specialty Notifications Start End  Ma Hillock, DO PCP - General Family Medicine  02/03/15     Chief Complaint  Patient presents with  . Annual Exam    Subjective:  Samantha Bradley is a 52 y.o.  Female  present for CPE. All past medical history, surgical history, allergies, family history, immunizations, medications and social history were updated in the electronic medical record today. All recent labs, ED visits and hospitalizations within the last year were reviewed.  Health maintenance: updated 03/07/18 Colonoscopy: Never had screening. Patient adamantly against colonoscopy. She was counseled on benefits today. She is agreeable to complete fecal occult blood cards consider colonoscopy if positive. 2018 FOBT negative.  Mammogram: completed: 04/15/2015, small left breast nodule, ultrasound completed showed likely benign small left breast nodule. Recommend follow-up 6-12 months, patient is still not interested in completing mammogram. She is aware of the risk and understands this is completed every 1-2 years in all women, not just with a questionable area.  Cervical cancer screening: last pap: 03/14/2015 normal with positive high risk HPV completed by this provider. Patient was encouraged to have repeat Pap in 12 months, which she did not schedule. She states that she understands the risks, but does not want to have Pap completed . Offered referral to GYN today- pt declined Immunizations: tdap UTD 01/30/2015, Influenza declined(encouraged yearly), shingrix declined Infectious disease screening: HIV and Hep C pt reports she's had this completed in the past and was normal. DEXA: n/a DEXA: n/a Assistive device: none Oxygen Samantha Bradley Patient has a Dental home. Hospitalizations/ED visits: reviewed  Depression screen [facility] 2/9 03/07/2018 03/06/2017 01/30/2016 03/22/2015    Decreased Interest 0 0 0 0  Down, Depressed, Hopeless 0 0 0 0  PHQ - 2 Score 0 0 0 0   No flowsheet data found.   Immunization History  Administered Date(s) Administered  . Tdap 01/30/2015    Past Medical History:  Diagnosis Date  . Asthma   . Heme positive stool 2014   declined GI referral  . Hypercholesterolemia   . Vitamin D deficiency    Allergies  Allergen Reactions  . Penicillins Rash   Past Surgical History:  Procedure Laterality Date  . APPENDECTOMY    . CESAREAN SECTION    . CHOLECYSTECTOMY    . TONSILLECTOMY AND ADENOIDECTOMY    . TUBAL LIGATION    . WISDOM TOOTH EXTRACTION     Family History  Problem Relation Age of Onset  . COPD Mother   . Asthma Mother   . Diabetes Mother   . Diabetes Father   . Cancer Father        MULTIPLE MYELOMA, LEUKEMIA  . Heart disease Father    Social History   Socioeconomic History  . Marital status: Single    Spouse name: Not on file  . Number of children: Not on file  . Years of education: Not on file  . Highest education level: Not on file  Occupational History  . Not on file  Social Needs  . Financial resource strain: Not on file  . Food insecurity:    Worry: Not on file    Inability: Not on file  . Transportation needs:    Medical: Not on file    Non-medical: Not on file  Tobacco Use  . Smoking status: Current Every Day Smoker  Types: Cigarettes  . Smokeless tobacco: Never Used  Substance and Sexual Activity  . Alcohol use: Yes    Alcohol/week: 0.0 standard drinks    Comment: occasional  . Drug use: No  . Sexual activity: Yes    Partners: Male    Birth control/protection: Surgical  Lifestyle  . Physical activity:    Days per week: Not on file    Minutes per session: Not on file  . Stress: Not on file  Relationships  . Social connections:    Talks on phone: Not on file    Gets together: Not on file    Attends religious service: Not on file    Active member of club or organization: Not on  file    Attends meetings of clubs or organizations: Not on file    Relationship status: Not on file  . Intimate partner violence:    Fear of current or ex partner: Not on file    Emotionally abused: Not on file    Physically abused: Not on file    Forced sexual activity: Not on file  Other Topics Concern  . Not on file  Social History Narrative   Single. G3P3.  In a relationship, female partner. She feels dafe in her relationship.   Moved to [place] about 12 years ago. 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   Wears seatbelt, has fire alarm in the home, no guns in the home.    Allergies as of 03/07/2018      Reactions   Penicillins Rash      Medication List        Accurate as of 03/07/18  9:21 AM. Always use your most recent med list.          aspirin 81 MG tablet Take 81 mg by mouth daily.   atorvastatin 20 MG tablet Commonly known as:  LIPITOR Take 1 tablet (20 mg total) by mouth daily.   cholecalciferol 1000 units tablet Commonly known as:  VITAMIN D Take 1,000 Units by mouth daily.   fish oil-omega-3 fatty acids 1000 MG capsule Take 2 g by mouth daily.   meloxicam 15 MG tablet Commonly known as:  MOBIC Take 1 tablet (15 mg total) by mouth daily.   multivitamin tablet Take 1 tablet by mouth daily.       All past medical history, surgical history, allergies, family history, immunizations andmedications were updated in the EMR today and reviewed under the history and medication portions of their EMR.     No results found for this or any previous visit (from the past 2160 hour(s)).  ROS: 14 pt review of systems performed and negative (unless mentioned in an HPI)  Objective: BP 138/82 (BP Location: Left Arm, Patient Position: Sitting, Cuff Size: Normal)   Pulse 83   Temp 98.2 F (36.8 C)   Resp 20   Ht 5' (1.524 m)   Wt 127 lb (57.6 kg)   SpO2 98%   BMI 24.80 kg/m  Gen: Afebrile. No acute distress. Nontoxic in appearance,  well-developed, well-nourished,  Pleasant caucasian female.  HENT: AT. [place]. Bilateral TM visualized and normal in appearance, normal external auditory canal. MMM, no oral lesions, adequate dentition. Bilateral nares within normal limits. Throat without erythema, ulcerations or exudates. no Cough on exam, no hoarseness on exam. Eyes:Pupils Equal Round Reactive to light, Extraocular movements intact,  Conjunctiva without redness, discharge or icterus. Neck/lymp/endocrine: Supple,no lymphadenopathy, no thyromegaly CV: RRR no murmur, no edema, +2/4 P  posterior tibialis pulses. no carotid bruits. No JVD. Chest: CTAB, no wheeze, rhonchi or crackles. normal Respiratory effort. good Air movement. Abd: Soft. flat. NTND. BS present. no Masses palpated. No hepatosplenomegaly. No rebound tenderness or guarding. Skin: no rashes, purpura or petechiae. Warm and well-perfused. Skin intact. Neuro/Msk:  Normal gait. PERLA. EOMi. Alert. Oriented x3.  Cranial nerves II through XII intact. Muscle strength 5/5 upper/lower extremity. DTRs equal bilaterally. Psych: Normal affect, dress and demeanor. Normal speech. Normal thought content and judgment.  No exam data present  Assessment/plan: Samantha Bradley is a 52 y.o. female present for CPE. Hyperlipidemia, unspecified hyperlipidemia type/on statin Diet and exercise - Lipid panel - TSH 3Encounter for long-term current use of medication - Comp Met (CMET) Elevated hemoglobin A1c - HgB A1c Screening for deficiency anemia - CBC w/Diff Encounter for preventive health examination Patient was encouraged to exercise greater than 150 minutes a week. Patient was encouraged to choose a diet filled with fresh fruits and vegetables, and lean meats. AVS provided to patient today for education/recommendation on gender specific health and safety maintenance. Colonoscopy: Never had screening. Patient adamantly against colonoscopy. She was counseled on benefits today. She is  agreeable to complete fecal occult blood cards consider colonoscopy if positive. 2018 FOBT negative.  Mammogram: completed: 04/15/2015, small left breast nodule, ultrasound completed showed likely benign small left breast nodule. Recommend follow-up 6-12 months, patient is still not interested in completing mammogram. She is aware of the risk and understands this is completed every 1-2 years in all women, not just with a questionable area.  Cervical cancer screening: last pap: 03/14/2015 normal with positive high risk HPV completed by this provider. Patient was encouraged to have repeat Pap in 12 months, which she did not schedule. She states that she understands the risks, but does not want to have Pap completed . Offered referral to GYN today- pt declined Immunizations: tdap UTD 01/30/2015, Influenza declined(encouraged yearly), shingrix declined Infectious disease screening: HIV and Hep C pt reports she's had this completed in the past and was normal. DEXA: n/a   Return in about 1 year (around 03/08/2019) for CPE.  Electronically signed by: Howard Pouch, DO [place]

## 2018-03-07 NOTE — Patient Instructions (Signed)

## 2018-03-10 ENCOUNTER — Telehealth: Payer: Self-pay | Admitting: Family Medicine

## 2018-03-10 DIAGNOSIS — D729 Disorder of white blood cells, unspecified: Secondary | ICD-10-CM

## 2018-03-10 DIAGNOSIS — Z1239 Encounter for other screening for malignant neoplasm of breast: Secondary | ICD-10-CM

## 2018-03-10 NOTE — Telephone Encounter (Signed)
Please inform patient the following information: Her cholesterol  looks great. Continue the Lipitor.  Her thyroid is functioning normal.  Her diabetes screen is still in the "prediabetes" range- exercise and low sugar diet will help.  She does have elevated calcium and WBC, which are concerning.   - This can mean either an infection or even more serious condition such as malignancies.    - stop vit d supplement- and we will recheck her calcium and CBC in 4 weeks to see if normalized. Lab appt ok- orders placed. If abnormal again would then need to see her.     I strongly encourage her to have screenings completed- I have placed the order for mammogram. She has declined further evaluation in the past. Ultimately this is her decision, however with labs looking the way they do, I strongly feel she should reconsider rand have placed the orders for in the event she changed her mind.

## 2018-03-10 NOTE — Telephone Encounter (Signed)
Called patient left message for patient to return call 

## 2018-03-11 NOTE — Telephone Encounter (Signed)
Left detailed message with results and instructions on patient voice mail per DPR patient needs lab appt around 04/09/18.

## 2018-03-17 ENCOUNTER — Other Ambulatory Visit: Payer: BLUE CROSS/BLUE SHIELD

## 2018-03-17 DIAGNOSIS — Z1211 Encounter for screening for malignant neoplasm of colon: Secondary | ICD-10-CM

## 2018-03-17 LAB — HEMOCCULT SLIDES (X 3 CARDS)
Fecal Occult Blood: NEGATIVE
OCCULT 1: NEGATIVE
OCCULT 2: NEGATIVE
OCCULT 3: NEGATIVE
OCCULT 4: NEGATIVE
OCCULT 5: NEGATIVE

## 2019-03-09 ENCOUNTER — Ambulatory Visit (INDEPENDENT_AMBULATORY_CARE_PROVIDER_SITE_OTHER): Payer: BC Managed Care – PPO | Admitting: Family Medicine

## 2019-03-09 ENCOUNTER — Other Ambulatory Visit: Payer: Self-pay

## 2019-03-09 ENCOUNTER — Encounter: Payer: Self-pay | Admitting: Family Medicine

## 2019-03-09 VITALS — BP 130/82 | HR 90 | Temp 98.7°F | Resp 16 | Ht 62.0 in | Wt 140.2 lb

## 2019-03-09 DIAGNOSIS — Z87891 Personal history of nicotine dependence: Secondary | ICD-10-CM

## 2019-03-09 DIAGNOSIS — E663 Overweight: Secondary | ICD-10-CM | POA: Diagnosis not present

## 2019-03-09 DIAGNOSIS — Z0000 Encounter for general adult medical examination without abnormal findings: Secondary | ICD-10-CM | POA: Diagnosis not present

## 2019-03-09 DIAGNOSIS — Z131 Encounter for screening for diabetes mellitus: Secondary | ICD-10-CM | POA: Diagnosis not present

## 2019-03-09 DIAGNOSIS — M1990 Unspecified osteoarthritis, unspecified site: Secondary | ICD-10-CM

## 2019-03-09 DIAGNOSIS — Z130 Encounter for screening for diseases of the blood and blood-forming organs and certain disorders involving the immune mechanism: Secondary | ICD-10-CM

## 2019-03-09 DIAGNOSIS — E785 Hyperlipidemia, unspecified: Secondary | ICD-10-CM | POA: Diagnosis not present

## 2019-03-09 DIAGNOSIS — Z2821 Immunization not carried out because of patient refusal: Secondary | ICD-10-CM

## 2019-03-09 DIAGNOSIS — H6120 Impacted cerumen, unspecified ear: Secondary | ICD-10-CM

## 2019-03-09 DIAGNOSIS — R928 Other abnormal and inconclusive findings on diagnostic imaging of breast: Secondary | ICD-10-CM

## 2019-03-09 DIAGNOSIS — Z1211 Encounter for screening for malignant neoplasm of colon: Secondary | ICD-10-CM

## 2019-03-09 DIAGNOSIS — Z5320 Procedure and treatment not carried out because of patient's decision for unspecified reasons: Secondary | ICD-10-CM | POA: Insufficient documentation

## 2019-03-09 LAB — CBC WITH DIFFERENTIAL/PLATELET
Basophils Absolute: 0.1 10*3/uL (ref 0.0–0.1)
Basophils Relative: 1 % (ref 0.0–3.0)
Eosinophils Absolute: 0.5 10*3/uL (ref 0.0–0.7)
Eosinophils Relative: 4.3 % (ref 0.0–5.0)
HCT: 43.6 % (ref 36.0–46.0)
Hemoglobin: 14.5 g/dL (ref 12.0–15.0)
Lymphocytes Relative: 17.5 % (ref 12.0–46.0)
Lymphs Abs: 1.9 10*3/uL (ref 0.7–4.0)
MCHC: 33.3 g/dL (ref 30.0–36.0)
MCV: 103.6 fl — ABNORMAL HIGH (ref 78.0–100.0)
Monocytes Absolute: 1.1 10*3/uL — ABNORMAL HIGH (ref 0.1–1.0)
Monocytes Relative: 9.9 % (ref 3.0–12.0)
Neutro Abs: 7.4 10*3/uL (ref 1.4–7.7)
Neutrophils Relative %: 67.3 % (ref 43.0–77.0)
Platelets: 391 10*3/uL (ref 150.0–400.0)
RBC: 4.2 Mil/uL (ref 3.87–5.11)
RDW: 13.8 % (ref 11.5–15.5)
WBC: 11 10*3/uL — ABNORMAL HIGH (ref 4.0–10.5)

## 2019-03-09 LAB — COMPREHENSIVE METABOLIC PANEL
ALT: 15 U/L (ref 0–35)
AST: 19 U/L (ref 0–37)
Albumin: 4.4 g/dL (ref 3.5–5.2)
Alkaline Phosphatase: 67 U/L (ref 39–117)
BUN: 22 mg/dL (ref 6–23)
CO2: 29 mEq/L (ref 19–32)
Calcium: 10.6 mg/dL — ABNORMAL HIGH (ref 8.4–10.5)
Chloride: 100 mEq/L (ref 96–112)
Creatinine, Ser: 0.79 mg/dL (ref 0.40–1.20)
GFR: 75.36 mL/min (ref >60.00)
Glucose, Bld: 101 mg/dL — ABNORMAL HIGH (ref 70–99)
Potassium: 4.5 mEq/L (ref 3.5–5.1)
Sodium: 137 mEq/L (ref 135–145)
Total Bilirubin: 0.5 mg/dL (ref 0.2–1.2)
Total Protein: 7.1 g/dL (ref 6.0–8.3)

## 2019-03-09 LAB — TSH: TSH: 2.48 u[IU]/mL (ref 0.35–4.50)

## 2019-03-09 LAB — LIPID PANEL
Cholesterol: 297 mg/dL — ABNORMAL HIGH (ref 0–200)
HDL: 57 mg/dL (ref >39.00)
LDL Cholesterol: 200 mg/dL — ABNORMAL HIGH (ref 0–99)
NonHDL: 239.6
Total CHOL/HDL Ratio: 5
Triglycerides: 197 mg/dL — ABNORMAL HIGH (ref 0.0–149.0)
VLDL: 39.4 mg/dL (ref 0.0–40.0)

## 2019-03-09 LAB — HEMOGLOBIN A1C: Hgb A1c MFr Bld: 5.7 % (ref 4.6–6.5)

## 2019-03-09 MED ORDER — MELOXICAM 15 MG PO TABS: 15.0000 mg | ORAL_TABLET | Freq: Every day | ORAL | 3 refills | Status: AC

## 2019-03-09 MED ORDER — DEBROX 6.5 % OT SOLN: 5.0000 [drp] | Freq: Two times a day (BID) | OTIC | 1 refills | Status: AC

## 2019-03-09 NOTE — Patient Instructions (Signed)
Health Maintenance, Female Adopting a healthy lifestyle and getting preventive care are important in promoting health and wellness. Ask your health care provider about:  The right schedule for you to have regular tests and exams.  Things you can do on your own to prevent diseases and keep yourself healthy. What should I know about diet, weight, and exercise? Eat a healthy diet   Eat a diet that includes plenty of vegetables, fruits, low-fat dairy products, and lean protein.  Do not eat a lot of foods that are high in solid fats, added sugars, or sodium. Maintain a healthy weight Body mass index (BMI) is used to identify weight problems. It estimates body fat based on height and weight. Your health care provider can help determine your BMI and help you achieve or maintain a healthy weight. Get regular exercise Get regular exercise. This is one of the most important things you can do for your health. Most adults should:  Exercise for at least 150 minutes each week. The exercise should increase your heart rate and make you sweat (moderate-intensity exercise).  Do strengthening exercises at least twice a week. This is in addition to the moderate-intensity exercise.  Spend less time sitting. Even light physical activity can be beneficial. Watch cholesterol and blood lipids Have your blood tested for lipids and cholesterol at 55 years of age, then have this test every 5 years. Have your cholesterol levels checked more often if:  Your lipid or cholesterol levels are high.  You are older than 55 years of age.  You are at high risk for heart disease. What should I know about cancer screening? Depending on your health history and family history, you may need to have cancer screening at various ages. This may include screening for:  Breast cancer.  Cervical cancer.  Colorectal cancer.  Skin cancer.  Lung cancer. What should I know about heart disease, diabetes, and high blood  pressure? Blood pressure and heart disease  High blood pressure causes heart disease and increases the risk of stroke. This is more likely to develop in people who have high blood pressure readings, are of African descent, or are overweight.  Have your blood pressure checked: ? Every 3-5 years if you are 18-39 years of age. ? Every year if you are 40 years old or older. Diabetes Have regular diabetes screenings. This checks your fasting blood sugar level. Have the screening done:  Once every three years after age 40 if you are at a normal weight and have a low risk for diabetes.  More often and at a younger age if you are overweight or have a high risk for diabetes. What should I know about preventing infection? Hepatitis B If you have a higher risk for hepatitis B, you should be screened for this virus. Talk with your health care provider to find out if you are at risk for hepatitis B infection. Hepatitis C Testing is recommended for:  Everyone born from 1945 through 1965.  Anyone with known risk factors for hepatitis C. Sexually transmitted infections (STIs)  Get screened for STIs, including gonorrhea and chlamydia, if: ? You are sexually active and are younger than 55 years of age. ? You are older than 55 years of age and your health care provider tells you that you are at risk for this type of infection. ? Your sexual activity has changed since you were last screened, and you are at increased risk for chlamydia or gonorrhea. Ask your health care provider if   you are at risk.  Ask your health care provider about whether you are at high risk for HIV. Your health care provider may recommend a prescription medicine to help prevent HIV infection. If you choose to take medicine to prevent HIV, you should first get tested for HIV. You should then be tested every 3 months for as long as you are taking the medicine. Pregnancy  If you are about to stop having your period (premenopausal) and  you may become pregnant, seek counseling before you get pregnant.  Take 400 to 800 micrograms (mcg) of folic acid every day if you become pregnant.  Ask for birth control (contraception) if you want to prevent pregnancy. Osteoporosis and menopause Osteoporosis is a disease in which the bones lose minerals and strength with aging. This can result in bone fractures. If you are 65 years old or older, or if you are at risk for osteoporosis and fractures, ask your health care provider if you should:  Be screened for bone loss.  Take a calcium or vitamin D supplement to lower your risk of fractures.  Be given hormone replacement therapy (HRT) to treat symptoms of menopause. Follow these instructions at home: Lifestyle  Do not use any products that contain nicotine or tobacco, such as cigarettes, e-cigarettes, and chewing tobacco. If you need help quitting, ask your health care provider.  Do not use street drugs.  Do not share needles.  Ask your health care provider for help if you need support or information about quitting drugs. Alcohol use  Do not drink alcohol if: ? Your health care provider tells you not to drink. ? You are pregnant, may be pregnant, or are planning to become pregnant.  If you drink alcohol: ? Limit how much you use to 0-1 drink a day. ? Limit intake if you are breastfeeding.  Be aware of how much alcohol is in your drink. In the [place] one drink equals one 12 oz bottle of beer (355 mL), one 5 oz glass of wine (148 mL), or one 1 oz glass of hard liquor (44 mL). General instructions  Schedule regular health, dental, and eye exams.  Stay current with your vaccines.  Tell your health care provider if: ? You often feel depressed. ? You have ever been abused or do not feel safe at home. Summary  Adopting a healthy lifestyle and getting preventive care are important in promoting health and wellness.  Follow your health care provider's instructions about healthy  diet, exercising, and getting tested or screened for diseases.  Follow your health care provider's instructions on monitoring your cholesterol and blood pressure. This information is not intended to replace advice given to you by your health care provider. Make sure you discuss any questions you have with your health care provider. Document Released: 11/06/2010 Document Revised: 04/16/2018 Document Reviewed: 04/16/2018 Elsevier Patient Education  2020 [employer]  

## 2019-03-09 NOTE — Progress Notes (Signed)
Patient ID: Samantha Bradley, female  DOB: 26-Aug-1965, 53 y.o.   MRN: [id number] Patient Care Team    Relationship Specialty Notifications Start End  Ma Hillock, DO PCP - General Family Medicine  02/03/15     Chief Complaint  Patient presents with  . Annual Exam    not fasting. patient does not want flu shot. mamm 04/2015. would like to do cologuard    Subjective:  Samantha Bradley is a 53 y.o.  Female  present for CPE. All past medical history, surgical history, allergies, family history, immunizations, medications and social history were updated in the electronic medical record today. All recent labs, ED visits and hospitalizations within the last year were reviewed.  Health maintenance:  Colonoscopy:Never had screening. Patient adamantly against colonoscopy. She was counseled on benefits today. She is agreeable to complete fecal occult blood cards consider colonoscopy if positive. 2019 FOBT negative.  Discussed cologuard with her today she is agreeable.  Mammogram: completed:04/15/2015,small left breast nodule, ultrasound completed showed likely benign small left breast nodule. Recommend follow-up 6-12 months, patient still declines screening or follow up today. She is aware of the risk and understands this is completed every 1-2 years in all women, not just with a questionable area.  Cervical cancer screening: last pap:03/14/2015 normal with positive high risk HPV completed by this provider. Patient was encouraged to have repeat Pap in 12 months. She has declined further testing. Offered referral to GYN today- She again declines.  Immunizations: tdapUTD 01/30/2015, Influenzadeclined today(encouraged yearly), shingrix declined today Infectious disease screening: HIVandHep Cptreports she's had this completed in the past and was normal. DEXA:n/a Assistive device: none Oxygen EHU:DJSH Patient has a Dental home. Hospitalizations/ED visits: reviewed  Depression screen  [facility] 2/9 03/09/2019 03/07/2018 03/06/2017 01/30/2016 03/22/2015  Decreased Interest 0 0 0 0 0  Down, Depressed, Hopeless 0 0 0 0 0  PHQ - 2 Score 0 0 0 0 0   No flowsheet data found.   Immunization History  Administered Date(s) Administered  . Tdap 01/30/2015   Past Medical History:  Diagnosis Date  . Asthma   . Heme positive stool 2014   declined GI referral  . Hypercholesterolemia   . Vitamin D deficiency    Allergies  Allergen Reactions  . Penicillins Rash   Past Surgical History:  Procedure Laterality Date  . APPENDECTOMY    . CESAREAN SECTION    . CHOLECYSTECTOMY    . TONSILLECTOMY AND ADENOIDECTOMY    . TUBAL LIGATION    . WISDOM TOOTH EXTRACTION     Family History  Problem Relation Age of Onset  . COPD Mother   . Asthma Mother   . Diabetes Mother   . Diabetes Father   . Cancer Father        MULTIPLE MYELOMA, LEUKEMIA  . Heart disease Father    Social History   Social History Narrative   Single. G3P3.  In a relationship, female partner. She feels dafe in her relationship.   Moved to [place] about 12 years ago. 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   Wears seatbelt, has fire alarm in the home, no guns in the home.     Allergies as of 03/09/2019      Reactions   Penicillins Rash      Medication List       Accurate as of March 09, 2019  9:27 AM. If you have any questions, ask your nurse or doctor.  STOP taking these medications   atorvastatin 20 MG tablet Commonly known as: LIPITOR Stopped by: Howard Pouch, DO     TAKE these medications   aspirin 81 MG tablet Take 81 mg by mouth daily.   calcium-vitamin D 500-200 MG-UNIT tablet Commonly known as: OSCAL WITH D Take 1 tablet by mouth.   Debrox 6.5 % OTIC solution Generic drug: carbamide peroxide Place 5 drops into both ears 2 (two) times daily. Started by: Howard Pouch, DO   fish oil-omega-3 fatty acids 1000 MG capsule Take 2 g by mouth daily.   meloxicam  15 MG tablet Commonly known as: MOBIC Take 1 tablet (15 mg total) by mouth daily.   multivitamin tablet Take 1 tablet by mouth daily.       All past medical history, surgical history, allergies, family history, immunizations andmedications were updated in the EMR today and reviewed under the history and medication portions of their EMR.     No results found for this or any previous visit (from the past 2160 hour(s)).   ROS: 14 pt review of systems performed and negative (unless mentioned in an HPI)  Objective: BP 130/82 (BP Location: Left Arm, Patient Position: Sitting, Cuff Size: Normal)   Pulse 90   Temp 98.7 F (37.1 C) (Temporal)   Resp 16   Ht '5\' 2"'$  (1.575 m)   Wt 140 lb 4 oz (63.6 kg)   SpO2 100%   BMI 25.65 kg/m  Gen: Afebrile. No acute distress. Nontoxic in appearance, well-developed, well-nourished,  Pleasant female.  HENT: AT. [place]. left TM visualized and normal in appearance. Rt TM unable to be visualized 2/2 to cerumen.  . MMM, no oral lesions, adequate dentition. Bilateral nares within normal limits. Throat without erythema, ulcerations or exudates. no Cough on exam, no hoarseness on exam. Eyes:Pupils Equal Round Reactive to light, Extraocular movements intact,  Conjunctiva without redness, discharge or icterus. Neck/lymp/endocrine: Supple,no lymphadenopathy, no thyromegaly CV: RRR no murmur, no edema, +2/4 P posterior tibialis pulses. n0 carotid bruits. No JVD. Chest: CTAB, no wheeze, rhonchi or crackles. normal Respiratory effort. good Air movement. Abd: Soft. flat. NTND. BS present. no Masses palpated. No hepatosplenomegaly. No rebound tenderness or guarding. Skin: no rashes, purpura or petechiae. Warm and well-perfused. Skin intact. Neuro/Msk:  Normal gait. PERLA. EOMi. Alert. Oriented x3.  Cranial nerves II through XII intact. Muscle strength 5/5 upper/lower extremity. DTRs equal bilaterally. Psych: Normal affect, dress and demeanor. Normal speech. Normal thought  content and judgment.   No exam data present  Assessment/plan: TANESHIA Samantha Bradley is a 53 y.o. female present for CPE Overweight (BMI 25.0-29.9) Diet and exercise encouraged.  Gaining weight since she stopped smoking.   Immunization not carried out because of patient refusal - declines influenza and shingrix today PAP smear declined/abnormal PAP  Pt has been counseled and understands her abnormal PAP could have progressed to cervical cancer. She declines further PAP and GYN referral.  Hyperlipidemia, unspecified hyperlipidemia type - declines statin- had been prescribed and quit. - Comprehensive metabolic panel - Lipid panel - TSH Arthritis:  Refilled mobic for her.  Abnormal mammogram of left breast/Mammogram declined Declined further mammograms.  Screening for deficiency anemia - CBC with Differential/Platelet Diabetes mellitus screening - Hemoglobin A1c Colon cancer screening - Cologuard- pt agreeable to cologuard testing.  Former smoker: Quit over the summer. Congratulated her.  Encounter for preventive health examination Patient was encouraged to exercise greater than 150 minutes a week. Patient was encouraged to choose a diet filled with  fresh fruits and vegetables, and lean meats. AVS provided to patient today for education/recommendation on gender specific health and safety maintenance.   Return in about 1 year (around 03/08/2020) for CPE (30 min).   Orders Placed This Encounter  Procedures  . CBC with Differential/Platelet  . Comprehensive metabolic panel  . Hemoglobin A1c  . Lipid panel  . TSH  . Cologuard     Electronically signed by: Howard Pouch, DO [place]

## 2019-03-10 ENCOUNTER — Telehealth: Payer: Self-pay | Admitting: Family Medicine

## 2019-03-10 DIAGNOSIS — D7589 Other specified diseases of blood and blood-forming organs: Secondary | ICD-10-CM

## 2019-03-10 MED ORDER — ATORVASTATIN CALCIUM 20 MG PO TABS: 20.0000 mg | ORAL_TABLET | Freq: Every day | ORAL | 3 refills | Status: AC

## 2019-03-10 NOTE — Telephone Encounter (Signed)
Please inform patient the following information: - Her liver, kidney function is normal.  Her A1c/diabetes screen is normal.  Her thyroid is functioning normal. - Her calcium is high again at 10.6.  This is stable from last year but above normal.  If she is taking a daily calcium supplement, I would encourage her to discontinue.  She can continue to take the vitamin D supplement alone without calcium. -Her blood counts are still resulting with a mildly elevated white blood cell count, this is two years consecutively that has occurred.  Her red blood cells are also larger than normal and they have become larger each year.  This is 3 years consecutive this has occurred.   - I would encouraged her to have further investigation into the cause of her blood changes that have been noted. It can be caused by illnesses, cancer, alcohol use, vitamin deficiencies. I would encourage her to make a follow up appt to discuss these in detail and we will also collect additional labs.   Her cholesterol is extremely  elevated.  Higher than 2 years ago before starting the statin.  Her cholesterol was normal last check when she was taking the statin.  However, patient elected to come off this medication.  I would recommend she restart and I have called this in for her today.  She is at a very high risk of having a heart attack or stroke with her cholesterol total of 297 and bad cholesterol 200.  Goal for the bad/LDL cholesterol is less than 130.

## 2019-03-10 NOTE — Telephone Encounter (Signed)
LMOM for pt to CB to discuss lab results and medication recommendations.

## 2019-03-10 NOTE — Telephone Encounter (Signed)
Pt was called and given information/results. She is not taking extra calcium at this time but will check her multivitamin. Pt will start statin for Lipids. She refused appt at this time as her insurance is changing and she has to make sure we are in network before getting labs drawn and appt. She will call back

## 2019-03-23 DIAGNOSIS — Z1211 Encounter for screening for malignant neoplasm of colon: Secondary | ICD-10-CM | POA: Diagnosis not present

## 2019-03-27 LAB — COLOGUARD: Cologuard: NEGATIVE

## 2019-04-09 DIAGNOSIS — Z20828 Contact with and (suspected) exposure to other viral communicable diseases: Secondary | ICD-10-CM | POA: Diagnosis not present

## 2019-04-14 DIAGNOSIS — Z20828 Contact with and (suspected) exposure to other viral communicable diseases: Secondary | ICD-10-CM | POA: Diagnosis not present

## 2019-04-27 DIAGNOSIS — Z20828 Contact with and (suspected) exposure to other viral communicable diseases: Secondary | ICD-10-CM | POA: Diagnosis not present

## 2019-05-04 DIAGNOSIS — Z20828 Contact with and (suspected) exposure to other viral communicable diseases: Secondary | ICD-10-CM | POA: Diagnosis not present

## 2019-05-12 DIAGNOSIS — Z20828 Contact with and (suspected) exposure to other viral communicable diseases: Secondary | ICD-10-CM | POA: Diagnosis not present

## 2019-05-19 DIAGNOSIS — Z20828 Contact with and (suspected) exposure to other viral communicable diseases: Secondary | ICD-10-CM | POA: Diagnosis not present

## 2019-05-26 DIAGNOSIS — Z20828 Contact with and (suspected) exposure to other viral communicable diseases: Secondary | ICD-10-CM | POA: Diagnosis not present

## 2019-06-02 DIAGNOSIS — Z20828 Contact with and (suspected) exposure to other viral communicable diseases: Secondary | ICD-10-CM | POA: Diagnosis not present

## 2019-06-09 DIAGNOSIS — Z20828 Contact with and (suspected) exposure to other viral communicable diseases: Secondary | ICD-10-CM | POA: Diagnosis not present

## 2019-08-19 ENCOUNTER — Telehealth: Payer: Self-pay

## 2019-08-19 NOTE — Telephone Encounter (Signed)
Pt called again asking for refill and told front desk staff she was going to wait on hold until she had a answer about her refill.   Called pharmacy and spoke with pharmacist and she said patient had #90 with 3 refills still left on file from 03/2019 RX that was sent by Dr Claiborne Billings. Pharmacist was asked to fill medication for patient.  Pt was spoken to by me and was told the medication was at the pharmacy, had been at the pharmacy, and she just needed to call. Pharmacy location given to patient.

## 2019-08-19 NOTE — Telephone Encounter (Signed)
Patient refill request   atorvastatin (LIPITOR) 20 MG tablet [[id number]]   Pharmacy  [facility] [phone] - [place], [place] - [address] [place]

## 2019-08-19 NOTE — Telephone Encounter (Signed)
Patient called back in stating that she went to pharmacy and they have no medication for her to pick up    Please advise

## 2019-08-19 NOTE — Telephone Encounter (Signed)
[facility] at [facility], pt called and told to call pharmacy

## 2020-02-25 ENCOUNTER — Telehealth (INDEPENDENT_AMBULATORY_CARE_PROVIDER_SITE_OTHER): Payer: BC Managed Care – PPO | Admitting: Family Medicine

## 2020-02-25 ENCOUNTER — Other Ambulatory Visit: Payer: Self-pay

## 2020-02-25 DIAGNOSIS — K1379 Other lesions of oral mucosa: Secondary | ICD-10-CM | POA: Diagnosis not present

## 2020-02-25 NOTE — Progress Notes (Signed)
Virtual Visit via Video Note  I connected with Samantha Bradley  on 02/25/20 at  3:00 PM EDT by a video enabled telemedicine application and verified that I am speaking with the correct person using two identifiers.  Location patient: work, [place] Location provider:work or home office Persons participating in the virtual visit: patient, provider  I discussed the limitations of evaluation and management by telemedicine and the availability of in person appointments. The patient expressed understanding and agreed to proceed.   HPI:  Acute telemedicine visit for swelling under the tongue: -Onset: 3 days ago -Symptoms include: swelling under the tongue, very painful, no purulence -Denies: fevers, purulence, malaise, tooth issues -Has tried: swishing with salt water -Pertinent past medical history:smoker -Pertinent medication allergies: penicillins -had a bug bite her on the lower face one day prior  ROS: See pertinent positives and negatives per HPI.  Past Medical History:  Diagnosis Date  . Asthma   . Heme positive stool 2014   declined GI referral  . Hypercholesterolemia   . Vitamin D deficiency     Past Surgical History:  Procedure Laterality Date  . APPENDECTOMY    . CESAREAN SECTION    . CHOLECYSTECTOMY    . TONSILLECTOMY AND ADENOIDECTOMY    . TUBAL LIGATION    . WISDOM TOOTH EXTRACTION       Current Outpatient Medications:  .  aspirin 81 MG tablet, Take 81 mg by mouth daily., Disp: , Rfl:  .  atorvastatin (LIPITOR) 20 MG tablet, Take 1 tablet (20 mg total) by mouth daily., Disp: 90 tablet, Rfl: 3 .  calcium-vitamin D (OSCAL WITH D) 500-200 MG-UNIT tablet, Take 1 tablet by mouth., Disp: , Rfl:  .  carbamide peroxide (DEBROX) 6.5 % OTIC solution, Place 5 drops into both ears 2 (two) times daily., Disp: 15 mL, Rfl: 1 .  fish oil-omega-3 fatty acids 1000 MG capsule, Take 2 g by mouth daily., Disp: , Rfl:  .  meloxicam (MOBIC) 15 MG tablet, Take 1 tablet (15 mg total) by mouth  daily., Disp: 90 tablet, Rfl: 3 .  Multiple Vitamin (MULTIVITAMIN) tablet, Take 1 tablet by mouth daily., Disp: , Rfl:   EXAM:  VITALS per patient if applicable:  GENERAL: alert, oriented, appears well and in no acute distress  HEENT: atraumatic, conjunttiva clear, no obvious abnormalities on inspection of external nose and ears, I am not able to do an exam of her mouth due to poor lighting and poor video quality.   NECK: normal movements of the head and neck  LUNGS: on inspection no signs of respiratory distress, breathing rate appears normal, no obvious gross SOB, gasping or wheezing  CV: no obvious cyanosis  MS: moves all visible extremities without noticeable abnormality  PSYCH/NEURO: pleasant and cooperative, no obvious depression or anxiety, speech and thought processing grossly intact  ASSESSMENT AND PLAN:  Discussed the following assessment and plan:  Mouth pain  -we discussed possible serious and likely etiologies, options for evaluation and workup, limitations of telemedicine visit vs in person visit, treatment, treatment risks and precautions. Pt prefers to treat via telemedicine empirically rather than in person at this moment.  Discussed possible salivary stone versus other.  Difficult to differentiate without a good exam.  She feels has gotten worse and is quite painful, and with her smoking history advised prompt in person evaluation either with ear nose and throat, PCP or urgent care.  Provided her with the contact information for [facility] ear nose and throat and Dr. Allene Pyo office.  Also discussed several urgent care options if she is not able to get in with PCP or ENT.  Advised could try lemon drops, gentle massage in the interim if does not aggravate.    I discussed the assessment and treatment plan with the patient. The patient was provided an opportunity to ask questions and all were answered. The patient agreed with the plan and demonstrated an understanding of  the instructions.     Terressa Koyanagi, DO

## 2020-02-25 NOTE — Patient Instructions (Addendum)
Please seek prompt inperson evaluation with Primary care doctor, Ear Nose and Throat specialist or at a local Urgent Care in the next 24 hours.   Dr. Allene Pyo Office (Ear Nose and Throat):[phone]  [facility], Nose and Throat: [phone]  Can try lemon drops and gentle massage with salt water rinses in the interim.

## 2020-02-29 ENCOUNTER — Ambulatory Visit (INDEPENDENT_AMBULATORY_CARE_PROVIDER_SITE_OTHER): Payer: BC Managed Care – PPO | Admitting: Otolaryngology

## 2020-02-29 ENCOUNTER — Encounter (INDEPENDENT_AMBULATORY_CARE_PROVIDER_SITE_OTHER): Payer: Self-pay | Admitting: Otolaryngology

## 2020-02-29 ENCOUNTER — Other Ambulatory Visit: Payer: Self-pay

## 2020-02-29 VITALS — Temp 97.7°F

## 2020-02-29 DIAGNOSIS — M270 Developmental disorders of jaws: Secondary | ICD-10-CM | POA: Diagnosis not present

## 2020-02-29 NOTE — Progress Notes (Signed)
HPI: Samantha Bradley is a 54 y.o. female who presents for evaluation of some lumps that she has noticed underneath her tongue over the past year.  She also complains of some soreness along the gum on the left side that radiates up toward the jaw.  This has been going on over the past week.  Past Medical History:  Diagnosis Date  . Asthma   . Heme positive stool 2014   declined GI referral  . Hypercholesterolemia   . Vitamin D deficiency    Past Surgical History:  Procedure Laterality Date  . APPENDECTOMY    . CESAREAN SECTION    . CHOLECYSTECTOMY    . TONSILLECTOMY AND ADENOIDECTOMY    . TUBAL LIGATION    . WISDOM TOOTH EXTRACTION     Social History   Socioeconomic History  . Marital status: Single    Spouse name: Not on file  . Number of children: Not on file  . Years of education: Not on file  . Highest education level: Not on file  Occupational History  . Not on file  Tobacco Use  . Smoking status: Current Every Day Smoker    Packs/day: 0.33    Years: 6.00    Pack years: 1.98    Types: Cigarettes    Start date: 2015  . Smokeless tobacco: Never Used  Vaping Use  . Vaping Use: Never used  Substance and Sexual Activity  . Alcohol use: Yes    Alcohol/week: 0.0 standard drinks    Comment: occasional  . Drug use: No  . Sexual activity: Yes    Partners: Male    Birth control/protection: Surgical  Other Topics Concern  . Not on file  Social History Narrative   Single. G3P3.  In a relationship, female partner. She feels dafe in her relationship.   Moved to [place] about 12 years ago. 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   Wears seatbelt, has fire alarm in the home, no guns in the home.    Social Determinants of Health   Financial Resource Strain:   . Difficulty of Paying Living Expenses: Not on file  Food Insecurity:   . Worried About Charity fundraiser in the Last Year: Not on file  . Ran Out of Food in the Last Year: Not on file   Transportation Needs:   . Lack of Transportation (Medical): Not on file  . Lack of Transportation (Non-Medical): Not on file  Physical Activity:   . Days of Exercise per Week: Not on file  . Minutes of Exercise per Session: Not on file  Stress:   . Feeling of Stress : Not on file  Social Connections:   . Frequency of Communication with Friends and Family: Not on file  . Frequency of Social Gatherings with Friends and Family: Not on file  . Attends Religious Services: Not on file  . Active Member of Clubs or Organizations: Not on file  . Attends Archivist Meetings: Not on file  . Marital Status: Not on file   Family History  Problem Relation Age of Onset  . COPD Mother   . Asthma Mother   . Diabetes Mother   . Diabetes Father   . Cancer Father        MULTIPLE MYELOMA, LEUKEMIA  . Heart disease Father    Allergies  Allergen Reactions  . Penicillins Rash   Prior to Admission medications   Medication Sig Start Date End Date Taking? Authorizing  Provider  aspirin 81 MG tablet Take 81 mg by mouth daily.   Yes [provider]  atorvastatin (LIPITOR) 20 MG tablet Take 1 tablet (20 mg total) by mouth daily. 03/10/19  Yes Kuneff, Renee A, DO  calcium-vitamin D (OSCAL WITH D) 500-200 MG-UNIT tablet Take 1 tablet by mouth.   Yes [provider]  carbamide peroxide (DEBROX) 6.5 % OTIC solution Place 5 drops into both ears 2 (two) times daily. 03/09/19  Yes Kuneff, Renee A, DO  fish oil-omega-3 fatty acids 1000 MG capsule Take 2 g by mouth daily.   Yes [provider]  meloxicam (MOBIC) 15 MG tablet Take 1 tablet (15 mg total) by mouth daily. 03/09/19  Yes Kuneff, Renee A, DO  Multiple Vitamin (MULTIVITAMIN) tablet Take 1 tablet by mouth daily.   Yes [provider]     Positive ROS: Otherwise negative  All other systems have been reviewed and were otherwise negative with the exception of those mentioned in the HPI and as above.  Physical  Exam: Constitutional: Alert, well-appearing, no acute distress Ears: External ears without lesions or tenderness. Ear canals are clear bilaterally with intact, clear TMs.  Nasal: External nose without lesions.. Clear nasal passages Oral: Lips and gums without lesions.  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  There are no mucosal abnormalities noted along the floor mouth.  Drainage from the submandibular ducts is clear.  She has several missing lower molar teeth on the left side.  She has minimal erythema of the mucosa over the mandible on this side and no ulceration or mucosal disruptions.  Remaining oral mucosa was normal to evaluation. Neck: No palpable adenopathy or masses.  No palpable submental, submandibular or neck adenopathy noted.  No submandibular gland enlargement.  No enlargement of the parotid gland on the left side. Respiratory: Breathing comfortably  Skin: No facial/neck lesions or rash noted.  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 Otherwise normal examination of the oral mucosa. I suspect the soreness on the gums of the mandible represent pressure from eating.  Plan: Recommended use of NSAIDs and topical oral analgesics for pain or discomfort. Suggested follow-up with dentist or oral surgery if she would like to have the torus removed.  Radene Journey, MD

## 2020-03-17 ENCOUNTER — Encounter: Payer: BC Managed Care – PPO | Admitting: Family Medicine

## 2021-07-18 ENCOUNTER — Other Ambulatory Visit: Payer: Self-pay

## 2021-07-18 ENCOUNTER — Telehealth: Payer: Self-pay | Admitting: Family Medicine
